# Patient Record
Sex: Female | Born: 1995 | Race: Black or African American | Hispanic: No | Smoking: Never smoker
Health system: Southern US, Community
[De-identification: ages and names within clinical notes are randomized; demographics above are authoritative.]

## PROBLEM LIST (undated history)

## (undated) DIAGNOSIS — M6740 Ganglion, unspecified site: Secondary | ICD-10-CM

## (undated) DIAGNOSIS — G5600 Carpal tunnel syndrome, unspecified upper limb: Secondary | ICD-10-CM

## (undated) DIAGNOSIS — M779 Enthesopathy, unspecified: Secondary | ICD-10-CM

## (undated) HISTORY — PX: CHOLECYSTECTOMY: SHX55

---

## 2014-06-06 ENCOUNTER — Emergency Department: Payer: Self-pay | Admitting: Emergency Medicine

## 2014-06-06 LAB — CBC WITH DIFFERENTIAL/PLATELET
BASOS PCT: 0.7 %
Basophil #: 0.1 10*3/uL (ref 0.0–0.1)
EOS PCT: 1.6 %
Eosinophil #: 0.1 10*3/uL (ref 0.0–0.7)
HCT: 42.5 % (ref 35.0–47.0)
HGB: 13.6 g/dL (ref 12.0–16.0)
Lymphocyte #: 2 10*3/uL (ref 1.0–3.6)
Lymphocyte %: 23.1 %
MCH: 29.4 pg (ref 26.0–34.0)
MCHC: 32 g/dL (ref 32.0–36.0)
MCV: 92 fL (ref 80–100)
Monocyte #: 0.6 x10 3/mm (ref 0.2–0.9)
Monocyte %: 6.5 %
Neutrophil #: 6 10*3/uL (ref 1.4–6.5)
Neutrophil %: 68.1 %
PLATELETS: 292 10*3/uL (ref 150–440)
RBC: 4.61 10*6/uL (ref 3.80–5.20)
RDW: 13.2 % (ref 11.5–14.5)
WBC: 8.8 10*3/uL (ref 3.6–11.0)

## 2014-06-06 LAB — URINALYSIS, COMPLETE
BLOOD: NEGATIVE
Bilirubin,UR: NEGATIVE
GLUCOSE, UR: NEGATIVE mg/dL (ref 0–75)
KETONE: NEGATIVE
NITRITE: NEGATIVE
Ph: 8 (ref 4.5–8.0)
Protein: 30
RBC,UR: 1 /HPF (ref 0–5)
SPECIFIC GRAVITY: 1.026 (ref 1.003–1.030)

## 2014-06-06 LAB — COMPREHENSIVE METABOLIC PANEL
ANION GAP: 6 — AB (ref 7–16)
Albumin: 4.5 g/dL (ref 3.8–5.6)
Alkaline Phosphatase: 55 U/L
BUN: 12 mg/dL (ref 7–18)
Bilirubin,Total: 0.4 mg/dL (ref 0.2–1.0)
CALCIUM: 9.2 mg/dL (ref 9.0–10.7)
Chloride: 107 mmol/L (ref 98–107)
Co2: 26 mmol/L (ref 21–32)
Creatinine: 0.97 mg/dL (ref 0.60–1.30)
GLUCOSE: 91 mg/dL (ref 65–99)
OSMOLALITY: 277 (ref 275–301)
POTASSIUM: 3.5 mmol/L (ref 3.5–5.1)
SGOT(AST): 15 U/L (ref 0–26)
SGPT (ALT): 23 U/L
SODIUM: 139 mmol/L (ref 136–145)
Total Protein: 8.1 g/dL (ref 6.4–8.6)

## 2014-06-06 LAB — HCG, QUANTITATIVE, PREGNANCY

## 2014-06-06 LAB — GC/CHLAMYDIA PROBE AMP

## 2014-06-07 LAB — WET PREP, GENITAL

## 2015-10-23 ENCOUNTER — Encounter: Payer: Self-pay | Admitting: Emergency Medicine

## 2015-10-23 ENCOUNTER — Emergency Department
Admission: EM | Admit: 2015-10-23 | Discharge: 2015-10-23 | Disposition: A | Discharge status: Home / Self Care | Payer: Self-pay | Attending: Emergency Medicine | Admitting: Emergency Medicine

## 2015-10-23 DIAGNOSIS — J069 Acute upper respiratory infection, unspecified: Secondary | ICD-10-CM | POA: Insufficient documentation

## 2015-10-23 DIAGNOSIS — R112 Nausea with vomiting, unspecified: Secondary | ICD-10-CM | POA: Insufficient documentation

## 2015-10-23 LAB — URINALYSIS COMPLETE WITH MICROSCOPIC (ARMC ONLY)
BACTERIA UA: NONE SEEN
Bilirubin Urine: NEGATIVE
Glucose, UA: NEGATIVE mg/dL
Hgb urine dipstick: NEGATIVE
Leukocytes, UA: NEGATIVE
Nitrite: NEGATIVE
PH: 6 (ref 5.0–8.0)
PROTEIN: 30 mg/dL — AB
Specific Gravity, Urine: 1.031 — ABNORMAL HIGH (ref 1.005–1.030)

## 2015-10-23 LAB — CBC WITH DIFFERENTIAL/PLATELET
Basophils Absolute: 0 10*3/uL (ref 0–0.1)
Basophils Relative: 0 %
EOS ABS: 0 10*3/uL (ref 0–0.7)
Eosinophils Relative: 0 %
HCT: 37.7 % (ref 35.0–47.0)
HEMOGLOBIN: 12.6 g/dL (ref 12.0–16.0)
LYMPHS ABS: 1.1 10*3/uL (ref 1.0–3.6)
LYMPHS PCT: 10 %
MCH: 30.1 pg (ref 26.0–34.0)
MCHC: 33.5 g/dL (ref 32.0–36.0)
MCV: 89.8 fL (ref 80.0–100.0)
MONOS PCT: 8 %
Monocytes Absolute: 0.8 10*3/uL (ref 0.2–0.9)
NEUTROS PCT: 82 %
Neutro Abs: 8.7 10*3/uL — ABNORMAL HIGH (ref 1.4–6.5)
Platelets: 221 10*3/uL (ref 150–440)
RBC: 4.19 MIL/uL (ref 3.80–5.20)
RDW: 13.1 % (ref 11.5–14.5)
WBC: 10.7 10*3/uL (ref 3.6–11.0)

## 2015-10-23 LAB — POCT PREGNANCY, URINE: PREG TEST UR: NEGATIVE

## 2015-10-23 LAB — BASIC METABOLIC PANEL
Anion gap: 7 (ref 5–15)
BUN: 7 mg/dL (ref 6–20)
CHLORIDE: 105 mmol/L (ref 101–111)
CO2: 23 mmol/L (ref 22–32)
CREATININE: 0.69 mg/dL (ref 0.44–1.00)
Calcium: 8.8 mg/dL — ABNORMAL LOW (ref 8.9–10.3)
GFR calc Af Amer: >60 mL/min (ref >60)
GFR calc non Af Amer: >60 mL/min (ref >60)
GLUCOSE: 101 mg/dL — AB (ref 65–99)
POTASSIUM: 3 mmol/L — AB (ref 3.5–5.1)
SODIUM: 135 mmol/L (ref 135–145)

## 2015-10-23 MED ORDER — PROMETHAZINE HCL 25 MG PO TABS: 25.0000 mg | ORAL_TABLET | Freq: Four times a day (QID) | ORAL | Status: DC | PRN

## 2015-10-23 MED ORDER — SODIUM CHLORIDE 0.9 % IV BOLUS (SEPSIS)
1000.0000 mL | Freq: Once | INTRAVENOUS | Status: AC
  Administered 2015-10-23: 1000 mL via INTRAVENOUS

## 2015-10-23 MED ORDER — AZITHROMYCIN 250 MG PO TABS: ORAL_TABLET | ORAL | Status: DC

## 2015-10-23 MED ORDER — GUAIFENESIN-CODEINE 100-10 MG/5ML PO SOLN: 10.0000 mL | ORAL | Status: DC | PRN

## 2015-10-23 MED ORDER — PROMETHAZINE HCL 25 MG PO TABS
25.0000 mg | ORAL_TABLET | Freq: Once | ORAL | Status: AC
  Administered 2015-10-23: 25 mg via ORAL
  Filled 2015-10-23: qty 1

## 2015-10-23 NOTE — ED Provider Notes (Signed)
[facility] Emergency Department Provider Note  ____________________________________________  Time seen: Approximately 2:16 PM  I have reviewed the triage vital signs and the nursing notes.   HISTORY  Chief Complaint Emesis    HPI Jordan George is a 20 y.o. female who presents for evaluation of sinus congestion sore throat cough for 3 days. Sudden onset of vomiting this morning. Patient states still feels nauseated with low-grade fever. Has felt hot and cold over the last several days. Head feels off.. Throat felt like she was swallowing glass but has gotten better with that. LMP was 2 days ago for 1 day only.   History reviewed. No pertinent past medical history.  There are no active problems to display for this patient.   Past Surgical History  Procedure Laterality Date  . Cholecystectomy      Current Outpatient Rx  Name  Route  Sig  Dispense  Refill  . azithromycin (ZITHROMAX Z-PAK) 250 MG tablet      Take 2 tablets (500 mg) on  Day 1,  followed by 1 tablet (250 mg) once daily on Days 2 through 5.   6 each   0   . guaiFENesin-codeine 100-10 MG/5ML syrup   Oral   Take 10 mLs by mouth every 4 (four) hours as needed for cough.   180 mL   0   . promethazine (PHENERGAN) 25 MG tablet   Oral   Take 1 tablet (25 mg total) by mouth every 6 (six) hours as needed for nausea or vomiting.   12 tablet   0     Allergies Review of patient's allergies indicates no known allergies.  No family history on file.  Social History Social History  Substance Use Topics  . Smoking status: Never Smoker   . Smokeless tobacco: None  . Alcohol Use: No    Review of Systems Constitutional: Positive fever/chills ENT: Positive sore throat. Cardiovascular: Denies chest pain. Respiratory: Denies shortness of breath. Positive for coughing Gastrointestinal: No abdominal pain.  Positive nausea, positive vomiting.  No diarrhea.   Genitourinary:  Negative for dysuria. Musculoskeletal: Negative for back pain. Skin: Negative for rash. Neurological: Negative for headaches, focal weakness or numbness.  10-point ROS otherwise negative.  ____________________________________________   PHYSICAL EXAM:  VITAL SIGNS: ED Triage Vitals  Enc Vitals Group     BP 10/23/15 1350 115/63 mmHg     Pulse Rate 10/23/15 1350 118     Resp 10/23/15 1350 20     Temp 10/23/15 1350 99.5 F (37.5 C)     Temp Source 10/23/15 1350 Oral     SpO2 10/23/15 1350 99 %     Weight 10/23/15 1350 110 lb (49.896 kg)     Height 10/23/15 1350 5\' 2"  (1.575 m)     Head Cir --      Peak Flow --      Pain Score 10/23/15 1352 8     Pain Loc --      Pain Edu? --      Excl. in GC? --     Constitutional: Alert and oriented. Well appearing and in no acute distress. Eyes: Conjunctivae are normal. PERRL. EOMI. Head: Atraumatic. Positive for frontal, maxillary sinus tenderness bilaterally. Nose: Positive congestion/rhinnorhea. Mouth/Throat: Mucous membranes are moist.  Oropharynx non-erythematous. Neck: No stridor.  Full range of motion nontender Cardiovascular: Normal rate, regular rhythm. Grossly normal heart sounds.  Good peripheral circulation. Respiratory: Normal respiratory effort.  No retractions. Lungs CTAB. Gastrointestinal: Soft and  nontender. No distention. No abdominal bruits. No CVA tenderness. Musculoskeletal: No lower extremity tenderness nor edema.  No joint effusions. Neurologic:  Normal speech and language. No gross focal neurologic deficits are appreciated. No gait instability. Skin:  Skin is warm, dry and intact. No rash noted. Psychiatric: Mood and affect are normal. Speech and behavior are normal.  ____________________________________________   LABS (all labs ordered are listed, but only abnormal results are displayed)  Labs Reviewed  CBC WITH DIFFERENTIAL/PLATELET - Abnormal; Notable for the following:    Neutro Abs 8.7 (*)    All other  components within normal limits  BASIC METABOLIC PANEL - Abnormal; Notable for the following:    Potassium 3.0 (*)    Glucose, Bld 101 (*)    Calcium 8.8 (*)    All other components within normal limits  URINALYSIS COMPLETEWITH MICROSCOPIC ([facility] ONLY) - Abnormal; Notable for the following:    Color, Urine YELLOW (*)    APPearance HAZY (*)    Ketones, ur 2+ (*)    Specific Gravity, Urine 1.031 (*)    Protein, ur 30 (*)    Squamous Epithelial / LPF 0-5 (*)    All other components within normal limits  POC URINE PREG, ED  POCT PREGNANCY, URINE      PROCEDURES  Procedure(s) performed: None  Critical Care performed: No  ____________________________________________   INITIAL IMPRESSION / ASSESSMENT AND PLAN / ED COURSE  Pertinent labs & imaging results that were available during my care of the patient were reviewed by me and considered in my medical decision making (see chart for details).  Reviewed labs. Patient is slightly dehydrated potassium 3.02+ ketones in her urine. We'll rehydrate with a bolus of normal saline. Discharge home with Rx for Phenergan, Z-Pak, Robitussin-AC. ____________________________________________   FINAL CLINICAL IMPRESSION(S) / ED DIAGNOSES  Final diagnoses:  URI, acute  Nausea and vomiting, vomiting of unspecified type     This chart was dictated using voice recognition software/Dragon. Despite best efforts to proofread, errors can occur which can change the meaning. Any change was purely unintentional.   Evangeline Dakin, PA-C 10/23/15 1540  Sharyn Creamer, MD 10/23/15 1547

## 2015-10-23 NOTE — ED Notes (Signed)
Pt reports sinus congestion x3 days; reports 3 episodes of vomiting today.

## 2015-10-23 NOTE — ED Notes (Signed)
See triage note.states she had some sinus congestion and cough for 3 days  But states she developed some vomiting after she got to work today  No vomiting on arrival  Low grade fever on arrival

## 2015-10-23 NOTE — Discharge Instructions (Signed)
Upper Respiratory Infection, Adult °Most upper respiratory infections (URIs) are a viral infection of the air passages leading to the lungs. A URI affects the nose, throat, and upper air passages. The most common type of URI is nasopharyngitis and is typically referred to as "the common cold." °URIs run their course and usually go away on their own. Most of the time, a URI does not require medical attention, but sometimes a bacterial infection in the upper airways can follow a viral infection. This is called a secondary infection. Sinus and middle ear infections are common types of secondary upper respiratory infections. °Bacterial pneumonia can also complicate a URI. A URI can worsen asthma and chronic obstructive pulmonary disease (COPD). Sometimes, these complications can require emergency medical care and may be life threatening.  °CAUSES °Almost all URIs are caused by viruses. A virus is a type of germ and can spread from one person to another.  °RISKS FACTORS °You may be at risk for a URI if:  °· You smoke.   °· You have chronic heart or lung disease. °· You have a weakened defense (immune) system.   °· You are very young or very old.   °· You have nasal allergies or asthma. °· You work in crowded or poorly ventilated areas. °· You work in health care facilities or schools. °SIGNS AND SYMPTOMS  °Symptoms typically develop 2-3 days after you come in contact with a cold virus. Most viral URIs last 7-10 days. However, viral URIs from the influenza virus (flu virus) can last 14-18 days and are typically more severe. Symptoms may include:  °· Runny or stuffy (congested) nose.   °· Sneezing.   °· Cough.   °· Sore throat.   °· Headache.   °· Fatigue.   °· Fever.   °· Loss of appetite.   °· Pain in your forehead, behind your eyes, and over your cheekbones (sinus pain). °· Muscle aches.   °DIAGNOSIS  °Your health care provider may diagnose a URI by: °· Physical exam. °· Tests to check that your symptoms are not due to  another condition such as: °· Strep throat. °· Sinusitis. °· Pneumonia. °· Asthma. °TREATMENT  °A URI goes away on its own with time. It cannot be cured with medicines, but medicines may be prescribed or recommended to relieve symptoms. Medicines may help: °· Reduce your fever. °· Reduce your cough. °· Relieve nasal congestion. °HOME CARE INSTRUCTIONS  °· Take medicines only as directed by your health care provider.   °· Gargle warm saltwater or take cough drops to comfort your throat as directed by your health care provider. °· Use a warm mist humidifier or inhale steam from a shower to increase air moisture. This may make it easier to breathe. °· Drink enough fluid to keep your urine clear or pale yellow.   °· Eat soups and other clear broths and maintain good nutrition.   °· Rest as needed.   °· Return to work when your temperature has returned to normal or as your health care provider advises. You may need to stay home longer to avoid infecting others. You can also use a face mask and careful hand washing to prevent spread of the virus. °· Increase the usage of your inhaler if you have asthma.   °· Do not use any tobacco products, including cigarettes, chewing tobacco, or electronic cigarettes. If you need help quitting, ask your health care provider. °PREVENTION  °The best way to protect yourself from getting a cold is to practice good hygiene.  °· Avoid oral or hand contact with people with cold   symptoms.   Wash your hands often if contact occurs.  There is no clear evidence that vitamin C, vitamin E, echinacea, or exercise reduces the chance of developing a cold. However, it is always recommended to get plenty of rest, exercise, and practice good nutrition.  SEEK MEDICAL CARE IF:   You are getting worse rather than better.   Your symptoms are not controlled by medicine.   You have chills.  You have worsening shortness of breath.  You have brown or red mucus.  You have yellow or brown nasal  discharge.  You have pain in your face, especially when you bend forward.  You have a fever.  You have swollen neck glands.  You have pain while swallowing.  You have white areas in the back of your throat. SEEK IMMEDIATE MEDICAL CARE IF:   You have severe or persistent:  Headache.  Ear pain.  Sinus pain.  Chest pain.  You have chronic lung disease and any of the following:  Wheezing.  Prolonged cough.  Coughing up blood.  A change in your usual mucus.  You have a stiff neck.  You have changes in your:  Vision.  Hearing.  Thinking.  Mood. MAKE SURE YOU:   Understand these instructions.  Will watch your condition.  Will get help right away if you are not doing well or get worse.   This information is not intended to replace advice given to you by your health care provider. Make sure you discuss any questions you have with your health care provider.   Document Released: 12/21/2000 Document Revised: 11/11/2014 Document Reviewed: 10/02/2013 Elsevier Interactive Patient Education 2016 Elsevier Inc.  Nausea and Vomiting Nausea means you feel sick to your stomach. Throwing up (vomiting) is a reflex where stomach contents come out of your mouth. HOME CARE   Take medicine as told by your doctor.  Do not force yourself to eat. However, you do need to drink fluids.  If you feel like eating, eat a normal diet as told by your doctor.  Eat rice, wheat, potatoes, bread, lean meats, yogurt, fruits, and vegetables.  Avoid high-fat foods.  Drink enough fluids to keep your pee (urine) clear or pale yellow.  Ask your doctor how to replace body fluid losses (rehydrate). Signs of body fluid loss (dehydration) include:  Feeling very thirsty.  Dry lips and mouth.  Feeling dizzy.  Dark pee.  Peeing less than normal.  Feeling confused.  Fast breathing or heart rate. GET HELP RIGHT AWAY IF:   You have blood in your throw up.  You have black or bloody  poop (stool).  You have a bad headache or stiff neck.  You feel confused.  You have bad belly (abdominal) pain.  You have chest pain or trouble breathing.  You do not pee at least once every 8 hours.  You have cold, clammy skin.  You keep throwing up after 24 to 48 hours.  You have a fever. MAKE SURE YOU:   Understand these instructions.  Will watch your condition.  Will get help right away if you are not doing well or get worse.   This information is not intended to replace advice given to you by your health care provider. Make sure you discuss any questions you have with your health care provider.   Document Released: 12/14/2007 Document Revised: 09/19/2011 Document Reviewed: 11/26/2010 Elsevier Interactive Patient Education [employer] Elsevier Inc.

## 2016-09-26 ENCOUNTER — Encounter (HOSPITAL_COMMUNITY): Payer: Self-pay | Admitting: Family Medicine

## 2016-09-26 ENCOUNTER — Ambulatory Visit (HOSPITAL_COMMUNITY)
Admission: EM | Admit: 2016-09-26 | Discharge: 2016-09-26 | Disposition: A | Discharge status: Home / Self Care | Payer: Self-pay | Attending: Family Medicine | Admitting: Family Medicine

## 2016-09-26 DIAGNOSIS — M67432 Ganglion, left wrist: Secondary | ICD-10-CM

## 2016-09-26 MED ORDER — PREDNISONE 20 MG PO TABS: ORAL_TABLET | ORAL | 0 refills | Status: DC

## 2016-09-26 NOTE — ED Triage Notes (Signed)
Pt here with left wrist pain. sts this pain is chronic but worse over the past few days. sts she usually wears and brace and that helps but not this time.

## 2016-09-26 NOTE — Discharge Instructions (Signed)
This a ganglion cyst that often occurs after repetitive motion type activities. The treatment of choice is surgery although the medicine given you should help somewhat. Please make an appointment with the orthopedist noted.

## 2016-09-26 NOTE — ED Provider Notes (Signed)
[facility]    CSN: [id number] Arrival date & time: 09/26/16  1001     History   Chief Complaint Chief Complaint  Patient presents with  . Wrist Pain    HPI Jordan George is a 21 y.o. female.   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 She's had chronic pain in her left wrist, initially along the radial aspect but now more dorsal. She is alsosome swelling in the dorsal aspect of her wrist. Pain got fairly intense last night and has not been relieved by ibuprofen. She's had no trauma to the wrist.      History reviewed. No pertinent past medical history.  There are no active problems to display for this patient.   Past Surgical History:  Procedure Laterality Date  . CHOLECYSTECTOMY      OB History    No data available       Home Medications    Prior to Admission medications   Medication Sig Start Date End Date Taking? Authorizing Provider  predniSONE (DELTASONE) 20 MG tablet Two daily with food 09/26/16   Elvina Sidle, MD    Family History History reviewed. No pertinent family history.  Social History Social History  Substance Use Topics  . Smoking status: Never Smoker  . Smokeless tobacco: Never Used  . Alcohol use No     Allergies   Patient has no known allergies.   Review of Systems Review of Systems  Musculoskeletal: Positive for joint swelling.  All other systems reviewed and are negative.    Physical Exam Triage Vital Signs ED Triage Vitals  Enc Vitals Group     BP 09/26/16 1014 121/70     Pulse Rate 09/26/16 1014 81     Resp 09/26/16 1014 18     Temp 09/26/16 1014 98.3 F (36.8 C)     Temp src --      SpO2 09/26/16 1014 100 %     Weight --      Height --      Head Circumference --      Peak Flow --      Pain Score 09/26/16 1017 7     Pain Loc --      Pain Edu? --      Excl. in GC? --    No data  found.   Updated Vital Signs BP 121/70   Pulse 81   Temp 98.3 F (36.8 C)   Resp 18   LMP 09/12/2016   SpO2 100%    Physical Exam  Constitutional: She is oriented to person, place, and time. She appears well-developed and well-nourished.  HENT:  Right Ear: External ear normal.  Left Ear: External ear normal.  Eyes: Conjunctivae and EOM are normal. Pupils are equal, round, and reactive to light.  Neck: Normal range of motion. Neck supple.  Pulmonary/Chest: Effort normal.  Musculoskeletal: Normal range of motion.  Tender dorsal left wrist with half centimeter movable insistent with ganglion cyst  Neurological: She is alert and oriented to person, place, and time.  Skin: Skin is warm and dry.  Nursing note and vitals reviewed.    UC Treatments / Results  Labs (all labs ordered are listed, but only abnormal results are displayed) Labs Reviewed - No data to display  EKG  EKG Interpretation None       Radiology No results found.  Procedures Procedures (including critical care  time)  Medications Ordered in UC Medications - No data to display   Initial Impression / Assessment and Plan / UC Course  I have reviewed the triage vital signs and the nursing notes.  Pertinent labs & imaging results that were available during my care of the patient were reviewed by me and considered in my medical decision making (see chart for details).     Final Clinical Impressions(s) / UC Diagnoses   Final diagnoses:  Ganglion of left wrist    New Prescriptions New Prescriptions   PREDNISONE (DELTASONE) 20 MG TABLET    Two daily with food     Elvina SidleKurt Carmell Elgin, MD 09/26/16 1039

## 2016-11-29 ENCOUNTER — Encounter: Payer: Self-pay | Admitting: Emergency Medicine

## 2016-11-29 DIAGNOSIS — J069 Acute upper respiratory infection, unspecified: Secondary | ICD-10-CM | POA: Insufficient documentation

## 2016-11-29 DIAGNOSIS — R55 Syncope and collapse: Secondary | ICD-10-CM | POA: Insufficient documentation

## 2016-11-29 LAB — COMPREHENSIVE METABOLIC PANEL
ALBUMIN: 4.5 g/dL (ref 3.5–5.0)
ALK PHOS: 41 U/L (ref 38–126)
ALT: 16 U/L (ref 14–54)
AST: 23 U/L (ref 15–41)
Anion gap: 7 (ref 5–15)
BILIRUBIN TOTAL: 0.8 mg/dL (ref 0.3–1.2)
BUN: 10 mg/dL (ref 6–20)
CALCIUM: 9.4 mg/dL (ref 8.9–10.3)
CO2: 26 mmol/L (ref 22–32)
Chloride: 103 mmol/L (ref 101–111)
Creatinine, Ser: 0.74 mg/dL (ref 0.44–1.00)
GFR calc Af Amer: >60 mL/min (ref >60)
GFR calc non Af Amer: >60 mL/min (ref >60)
GLUCOSE: 102 mg/dL — AB (ref 65–99)
Potassium: 4 mmol/L (ref 3.5–5.1)
SODIUM: 136 mmol/L (ref 135–145)
TOTAL PROTEIN: 8.2 g/dL — AB (ref 6.5–8.1)

## 2016-11-29 LAB — URINALYSIS, COMPLETE (UACMP) WITH MICROSCOPIC
BILIRUBIN URINE: NEGATIVE
Glucose, UA: NEGATIVE mg/dL
KETONES UR: 5 mg/dL — AB
LEUKOCYTES UA: NEGATIVE
Nitrite: NEGATIVE
PH: 5 (ref 5.0–8.0)
PROTEIN: 30 mg/dL — AB
Specific Gravity, Urine: 1.031 — ABNORMAL HIGH (ref 1.005–1.030)

## 2016-11-29 LAB — CBC
HEMATOCRIT: 40 % (ref 35.0–47.0)
HEMOGLOBIN: 13.5 g/dL (ref 12.0–16.0)
MCH: 30 pg (ref 26.0–34.0)
MCHC: 33.8 g/dL (ref 32.0–36.0)
MCV: 88.8 fL (ref 80.0–100.0)
Platelets: 257 10*3/uL (ref 150–440)
RBC: 4.5 MIL/uL (ref 3.80–5.20)
RDW: 13.3 % (ref 11.5–14.5)
WBC: 13.2 10*3/uL — ABNORMAL HIGH (ref 3.6–11.0)

## 2016-11-29 LAB — POCT PREGNANCY, URINE: PREG TEST UR: NEGATIVE

## 2016-11-29 LAB — LIPASE, BLOOD: Lipase: 25 U/L (ref 11–51)

## 2016-11-29 NOTE — ED Triage Notes (Signed)
Patient ambulatory to triage with steady gait, without difficulty or distress noted, mask in place; pt reports fever, congestion, HA since this morning with vomiting with general abd pain

## 2016-11-30 ENCOUNTER — Emergency Department
Admission: EM | Admit: 2016-11-30 | Discharge: 2016-11-30 | Disposition: A | Discharge status: Home / Self Care | Payer: Self-pay | Attending: Emergency Medicine | Admitting: Emergency Medicine

## 2016-11-30 DIAGNOSIS — J069 Acute upper respiratory infection, unspecified: Secondary | ICD-10-CM

## 2016-11-30 DIAGNOSIS — R55 Syncope and collapse: Secondary | ICD-10-CM

## 2016-11-30 LAB — TROPONIN I: Troponin I: <0.03 ng/mL (ref <0.03)

## 2016-11-30 MED ORDER — SODIUM CHLORIDE 0.9 % IV BOLUS (SEPSIS)
1000.0000 mL | Freq: Once | INTRAVENOUS | Status: AC
  Administered 2016-11-30: 1000 mL via INTRAVENOUS

## 2016-11-30 MED ORDER — KETOROLAC TROMETHAMINE 30 MG/ML IJ SOLN
30.0000 mg | Freq: Once | INTRAMUSCULAR | Status: AC
  Administered 2016-11-30: 30 mg via INTRAVENOUS
  Filled 2016-11-30: qty 1

## 2016-11-30 MED ORDER — ONDANSETRON HCL 4 MG/2ML IJ SOLN
4.0000 mg | Freq: Once | INTRAMUSCULAR | Status: AC
  Administered 2016-11-30: 4 mg via INTRAVENOUS
  Filled 2016-11-30: qty 2

## 2016-11-30 NOTE — ED Provider Notes (Addendum)
[facility] Emergency Department Provider Note  Time seen: 1:16 AM  I have reviewed the triage vital signs and the nursing notes.   HISTORY  Chief Complaint Fever and Emesis    HPI Jordan George is a 21 y.o. female with no past medical history who presents to the emergency Department after syncopal episode at work. According to the patient since Monday she has been experiencing a sore throat and congestion. States she began experiencing a fever yesterday along with cough congestion nausea with occasional episodes of vomiting. States mild abdominal cramping. Denies chest pain or shortness of breath. Patient states moderate headache. She was at work began feeling lightheaded and weak and had a brief syncopal first near syncopal episode. Patient states she had a fever of 102 earlier today and took ibuprofen but has not taken any medications since this morning.  History reviewed. No pertinent past medical history.  There are no active problems to display for this patient.   Past Surgical History:  Procedure Laterality Date  . CHOLECYSTECTOMY      Prior to Admission medications   Medication Sig Start Date End Date Taking? Authorizing Provider  predniSONE (DELTASONE) 20 MG tablet Two daily with food 09/26/16   Elvina Sidle, MD    No Known Allergies  No family history on file.  Social History Social History  Substance Use Topics  . Smoking status: Never Smoker  . Smokeless tobacco: Never Used  . Alcohol use No    Review of Systems Constitutional: Positive for fever Eyes: Negative for visual changes. ENT: Positive for congestion Cardiovascular: Negative for chest pain. Respiratory: Negative for shortness of breath. Positive for cough Gastrointestinal: Mild abdominal cramping. Positive for nausea and vomiting earlier today. Negative for diarrhea. Genitourinary: Negative for dysuria. Musculoskeletal: Negative for back pain. Skin:  Negative for rash. Neurological: Negative for headache All other ROS negative  ____________________________________________   PHYSICAL EXAM:  VITAL SIGNS: ED Triage Vitals  Enc Vitals Group     BP 11/29/16 2337 134/73     Pulse Rate 11/29/16 2337 89     Resp 11/29/16 2337 18     Temp 11/29/16 2337 98.8 F (37.1 C)     Temp Source 11/29/16 2337 Oral     SpO2 11/29/16 2337 99 %     Weight 11/29/16 2335 130 lb (59 kg)     Height 11/29/16 2335 5\' 2"  (1.575 m)     Head Circumference --      Peak Flow --      Pain Score 11/29/16 2335 7     Pain Loc --      Pain Edu? --      Excl. in GC? --     Constitutional: Alert and oriented. Well appearing and in no distress. Eyes: Normal exam ENT   Head: Normocephalic and atraumatic.   Mouth/Throat: Mucous membranes are moist. Moderate rhinorrhea. Mild frontal erythema without exudates or tonsillar hypertrophy. Cardiovascular: Normal rate, regular rhythm. No murmur Respiratory: Normal respiratory effort without tachypnea nor retractions. Breath sounds are clear. No wheezes rales or rhonchi. Gastrointestinal: Soft and nontender. No distention.   Musculoskeletal: Nontender with normal range of motion in all extremities.  Neurologic:  Normal speech and language. No gross focal neurologic deficits Skin:  Skin is warm, dry and intact.  Psychiatric: Mood and affect are normal. Speech and behavior are normal.   ____________________________________________   INITIAL IMPRESSION / ASSESSMENT AND PLAN / ED COURSE  Pertinent labs & imaging results that  were available during my care of the patient were reviewed by me and considered in my medical decision making (see chart for details).  Patient presents to emergency department after syncopal episode. Patient's workup shows a mild leukocytosis of 13,000, otherwise largely negative workup. Patient's symptoms are very suggestive of upper respiratory infection. Patient states congestion, sore  throat and fever and cough. Patient has had intermittent nausea and vomiting over the past 2 days which she states is usually when she takes medication. We will treat with IV fluids, Zofran and Toradol in the emergency department. Given the patient's syncopal episode pending a troponin and EKG.  Patient states she is feeling much better after medications. Patient's labs are largely within normal limits besides a mild leukocytosis. Troponin negative.  EKG reviewed and interpreted by myself shows normal sinus rhythm at 89 bpm, narrow QRS, normal axis, normal appearing intervals, no ST changes. ____________________________________________   FINAL CLINICAL IMPRESSION(S) / ED DIAGNOSES  URI syncope    Minna AntisPaduchowski, Daliah Chaudoin, MD 11/30/16 0120    Minna AntisPaduchowski, Imanii Gosdin, MD 11/30/16 [id number]

## 2016-11-30 NOTE — ED Notes (Addendum)
Patient states this started Monday with a sore throat. She got dayquil. Fever started Tuesday morning, got sent home from work, had vomiting after taking medication and eating. Patient does have a cough, congestion and headache. Patient states she "fell out" at work. Felt weak and passed out.

## 2017-03-22 ENCOUNTER — Ambulatory Visit (HOSPITAL_COMMUNITY)
Admission: EM | Admit: 2017-03-22 | Discharge: 2017-03-22 | Disposition: A | Discharge status: Home / Self Care | Payer: Self-pay | Attending: Family Medicine | Admitting: Family Medicine

## 2017-03-22 ENCOUNTER — Encounter (HOSPITAL_COMMUNITY): Payer: Self-pay | Admitting: Nurse Practitioner

## 2017-03-22 DIAGNOSIS — M25532 Pain in left wrist: Secondary | ICD-10-CM

## 2017-03-22 MED ORDER — KETOROLAC TROMETHAMINE 30 MG/ML IJ SOLN
INTRAMUSCULAR | Status: AC
  Filled 2017-03-22: qty 1

## 2017-03-22 MED ORDER — NAPROXEN 500 MG PO TABS: 500.0000 mg | ORAL_TABLET | Freq: Two times a day (BID) | ORAL | 0 refills | Status: DC

## 2017-03-22 MED ORDER — METHYLPREDNISOLONE SODIUM SUCC 125 MG IJ SOLR
80.0000 mg | Freq: Once | INTRAMUSCULAR | Status: AC
  Administered 2017-03-22: 80 mg via INTRAMUSCULAR

## 2017-03-22 MED ORDER — METHYLPREDNISOLONE SODIUM SUCC 125 MG IJ SOLR
INTRAMUSCULAR | Status: AC
  Filled 2017-03-22: qty 2

## 2017-03-22 MED ORDER — KETOROLAC TROMETHAMINE 30 MG/ML IJ SOLN
30.0000 mg | Freq: Once | INTRAMUSCULAR | Status: AC
  Administered 2017-03-22: 30 mg via INTRAMUSCULAR

## 2017-03-22 NOTE — ED Provider Notes (Signed)
[facility]    CSN: [id number] Arrival date & time: 03/22/17  1342     History   Chief Complaint Chief Complaint  Patient presents with  . Cyst    HPI Ardra Quentin Cornwall is a 21 y.o. female.   21 year old female comes in for left wrist pain. She was seen 6 months ago for similar symptoms, and was found to have a ganglion cyst, she was given a wrist splint and prednisone treatment, which she states helped her symptoms. 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 She recently rejoined the assembly line, and started having these symptoms. She states new materials are also heavier than what she used to do, which exacerbates the pain. Repetitive motion includes lots of turning of the wrist. She noticed numbness/tingling and pain radiation to her second and third fingers occasionally. She sleeps with her left wrist flexed, which exacerbates the pain. She has been wearing her splint to work, and has been taking Tylenol without relief. Denies swelling, spreading erythema, increased warmth.       History reviewed. No pertinent past medical history.  There are no active problems to display for this patient.   Past Surgical History:  Procedure Laterality Date  . CHOLECYSTECTOMY      OB History    No data available       Home Medications    Prior to Admission medications   Medication Sig Start Date End Date Taking? Authorizing Provider  naproxen (NAPROSYN) 500 MG tablet Take 1 tablet (500 mg total) by mouth 2 (two) times daily. 03/22/17   Belinda Fisher, PA-C  predniSONE (DELTASONE) 20 MG tablet Two daily with food 09/26/16   Elvina Sidle, MD    Family History History reviewed. No pertinent family history.  Social History Social History  Substance Use Topics  . Smoking  status: Never Smoker  . Smokeless tobacco: Never Used  . Alcohol use Yes     Allergies   Patient has no known allergies.   Review of Systems Review of Systems  Reason unable to perform ROS: See HPI as above.     Physical Exam Triage Vital Signs ED Triage Vitals  Enc Vitals Group     BP 03/22/17 1356 129/66     Pulse Rate 03/22/17 1356 80     Resp 03/22/17 1356 16     Temp 03/22/17 1356 98.5 F (36.9 C)     Temp Source 03/22/17 1356 Oral     SpO2 03/22/17 1356 100 %     Weight --      Height --      Head Circumference --      Peak Flow --      Pain Score 03/22/17 1359 7     Pain Loc --      Pain Edu? --      Excl. in GC? --    No data found.   Updated Vital Signs BP 129/66   Pulse 80   Temp 98.5 F (36.9 C) (Oral)   Resp 16   SpO2 100%   Physical Exam  Constitutional: She is oriented to person, place, and time. She appears well-developed and well-nourished. No distress.  HENT:  Head: Normocephalic and atraumatic.  Eyes: Pupils are equal, round, and reactive to light. Conjunctivae are normal.  Musculoskeletal:  No obvious swelling, erythema noted. Tenderness on palpation of dorsal aspect of left wrist, where ganglion cyst is located. No tenderness on palpation of the fingers. Full ROM though with pain. Grip strength slightly decreased in left hand. Sensation intact and equal bilaterally. Radial pulses 2+ and equal bilaterally. Cap refill unable to assess due to nail polish.   Negative Tinel, Finkelstein's. Positive Phalen.  Neurological: She is alert and oriented to person, place, and time.     UC Treatments / Results  Labs (all labs ordered are listed, but only abnormal results are displayed) Labs Reviewed - No data to display  EKG  EKG Interpretation None       Radiology No results found.  Procedures Procedures (including critical care time)  Medications Ordered in UC Medications  ketorolac (TORADOL) 30 MG/ML injection 30 mg (30 mg  Intramuscular Given 03/22/17 1509)  methylPREDNISolone sodium succinate (SOLU-MEDROL) 125 mg/2 mL injection 80 mg (80 mg Intramuscular Given 03/22/17 1510)     Initial Impression / Assessment and Plan / UC Course  I have reviewed the triage vital signs and the nursing notes.  Pertinent labs & imaging results that were available during my care of the patient were reviewed by me and considered in my medical decision making (see chart for details).    Start NSAIDs as directed. Ice compress. Wear wrist splint during work and sleep. Solu-Medrol injection in office. Patient to follow up with orthopedics for further evaluation and treatment needed.  Final Clinical Impressions(s) / UC Diagnoses   Final diagnoses:  Left wrist pain    New Prescriptions Discharge Medication List as of 03/22/2017  2:42 PM    START taking these medications   Details  naproxen (NAPROSYN) 500 MG tablet Take 1 tablet (500 mg total) by mouth 2 (two) times daily., Starting Wed 03/22/2017, Normal           Linward HeadlandYu, Margarette Vannatter V, PA-C 03/22/17 1517

## 2017-03-22 NOTE — ED Triage Notes (Signed)
Pt presents with c/o ganglion cyst. She was diagnosed with the cyst here in March. She was discharged home with instructions to ice the wrist and wear a splint for about three months. She followed discharge instructions with no improvement and has has increasing pain from the cyst. 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

## 2017-03-22 NOTE — Discharge Instructions (Signed)
Start Naproxen as directed. Wear wrist splint during sleep and activity. Ice compress. Make appointment with orthopedics for further evaluation and treatment needed.

## 2017-04-28 ENCOUNTER — Emergency Department
Admission: EM | Admit: 2017-04-28 | Discharge: 2017-04-28 | Disposition: A | Discharge status: Home / Self Care | Payer: Self-pay | Attending: Emergency Medicine | Admitting: Emergency Medicine

## 2017-04-28 ENCOUNTER — Encounter: Payer: Self-pay | Admitting: Emergency Medicine

## 2017-04-28 DIAGNOSIS — M779 Enthesopathy, unspecified: Secondary | ICD-10-CM | POA: Insufficient documentation

## 2017-04-28 DIAGNOSIS — M25532 Pain in left wrist: Secondary | ICD-10-CM | POA: Insufficient documentation

## 2017-04-28 DIAGNOSIS — Z79899 Other long term (current) drug therapy: Secondary | ICD-10-CM | POA: Insufficient documentation

## 2017-04-28 MED ORDER — KETOROLAC TROMETHAMINE 30 MG/ML IJ SOLN
60.0000 mg | Freq: Once | INTRAMUSCULAR | Status: AC
  Administered 2017-04-28: 60 mg via INTRAMUSCULAR
  Filled 2017-04-28: qty 2

## 2017-04-28 MED ORDER — IBUPROFEN 600 MG PO TABS: 600.0000 mg | ORAL_TABLET | Freq: Three times a day (TID) | ORAL | 0 refills | Status: DC | PRN

## 2017-04-28 MED ORDER — METHYLPREDNISOLONE 4 MG PO TBPK: ORAL_TABLET | ORAL | 0 refills | Status: DC

## 2017-04-28 MED ORDER — HYDROCODONE-ACETAMINOPHEN 5-325 MG PO TABS: 1.0000 | ORAL_TABLET | Freq: Four times a day (QID) | ORAL | 0 refills | Status: DC | PRN

## 2017-04-28 NOTE — ED Provider Notes (Signed)
[facility] Emergency Department Provider Note   ____________________________________________   First MD Initiated Contact with Patient 04/28/17 [id number]     (approximate)  I have reviewed the triage vital signs and the nursing notes.   HISTORY  Chief Complaint Wrist Pain    HPI Jordan George is a 21 y.o. female who presents to the ED from work [employer]) with a chief complaint of nontraumatic left wrist pain. Patient was diagnosed with left wrist tendinitis 2 months ago, is followed by [facility] orthopedics, had a steroid shot 2 months ago and is doing physical therapy. Wears a wrist splint. Tonight at work she was having to do rotating-type motions which exacerbated her pain. Took an ibuprofen multiple hours ago without relief of symptoms. Movement makes her pain worse. Complains of some numbness to her fourth and fifth digits. Did not strike her elbow or fall. Voices no other medical complaints. Specifically, denies fever, chills, chest pain, shortness breath, abdominal pain, nausea, vomiting.Denies recent falls/trauma/injury.   Past Medical History None  There are no active problems to display for this patient.   Past Surgical History:  Procedure Laterality Date  . CHOLECYSTECTOMY      Prior to Admission medications   Medication Sig Start Date End Date Taking? Authorizing Provider  HYDROcodone-acetaminophen (NORCO) 5-325 MG tablet Take 1 tablet by mouth every 6 (six) hours as needed for moderate pain. 04/28/17   Irean Hong, MD  ibuprofen (ADVIL,MOTRIN) 600 MG tablet Take 1 tablet (600 mg total) by mouth every 8 (eight) hours as needed. 04/28/17   Irean Hong, MD  methylPREDNISolone (MEDROL DOSEPAK) 4 MG TBPK tablet Take as directed 04/28/17   Irean Hong, MD  naproxen (NAPROSYN) 500 MG tablet Take 1 tablet (500 mg total) by mouth 2 (two) times daily. 03/22/17   Belinda Fisher, PA-C  predniSONE (DELTASONE) 20 MG tablet Two daily with food  09/26/16   Elvina Sidle, MD    Allergies Patient has no known allergies.  No family history on file.  Social History Social History  Substance Use Topics  . Smoking status: Never Smoker  . Smokeless tobacco: Never Used  . Alcohol use Yes    Review of Systems  Constitutional: No fever/chills. Eyes: No visual changes. ENT: No sore throat. Cardiovascular: Denies chest pain. Respiratory: Denies shortness of breath. Gastrointestinal: No abdominal pain.  No nausea, no vomiting.  No diarrhea.  No constipation. Genitourinary: Negative for dysuria. Musculoskeletal: positive for left wrist pain. Negative for back pain. Skin: Negative for rash. Neurological: Negative for headaches, focal weakness or numbness.   ____________________________________________   PHYSICAL EXAM:  VITAL SIGNS: ED Triage Vitals  Enc Vitals Group     BP 04/28/17 0136 115/75     Pulse Rate 04/28/17 0136 89     Resp 04/28/17 0136 16     Temp 04/28/17 0136 98.4 F (36.9 C)     Temp Source 04/28/17 0136 Oral     SpO2 04/28/17 0136 100 %     Weight 04/28/17 0134 130 lb (59 kg)     Height 04/28/17 0134 5\' 2"  (1.575 m)     Head Circumference --      Peak Flow --      Pain Score 04/28/17 0134 8     Pain Loc --      Pain Edu? --      Excl. in GC? --     Constitutional: Asleep, awakened for exam. Alert and oriented. Well appearing  and in no acute distress. Eyes: Conjunctivae are normal. PERRL. EOMI. Head: Atraumatic. Nose: No congestion/rhinnorhea. Mouth/Throat: Mucous membranes are moist.  Oropharynx non-erythematous. Neck: No stridor.  No cervical spine tenderness to palpation. Cardiovascular: Normal rate, regular rhythm. Grossly normal heart sounds.  Good peripheral circulation. Respiratory: Normal respiratory effort.  No retractions. Lungs CTAB. Gastrointestinal: Soft and nontender. No distention. No abdominal bruits. No CVA tenderness. Musculoskeletal:  Left wrist in Velcro splint. Splint  removed for examination. Dorsal medial wrist tender to palpation. No swelling. Full range of motion with some pain. Negative Tinel's. 2+ distal pulses. Brisk, less than 5 second capillary refill. Subjective decreased sensation to left fourth and fifth digits. No pain at left elbow. Neurologic:  Normal speech and language. No gross focal neurologic deficits are appreciated. No gait instability. Skin:  Skin is warm, dry and intact. No rash noted. Psychiatric: Mood and affect are normal. Speech and behavior are normal.  ____________________________________________   LABS (all labs ordered are listed, but only abnormal results are displayed)  Labs Reviewed - No data to display ____________________________________________  EKG  None ____________________________________________  RADIOLOGY  No results found.  ____________________________________________   PROCEDURES  Procedure(s) performed: None  Procedures  Critical Care performed: No  ____________________________________________   INITIAL IMPRESSION / ASSESSMENT AND PLAN / ED COURSE  As part of my medical decision making, I reviewed the following data within the electronic MEDICAL RECORD NUMBER Nursing notes reviewed and incorporated, Old chart reviewed and Notes from prior ED visits   21 year old female with tendinitis, [facility], who exacerbated wrist pain tonight while at work twisting a car part. Will treat with NSAID's, analgesia, Medrol Dosepak and patient will follow-up with orthopedics next week. Strict return precautions given. Patient verbalizes understanding and agrees with plan of care.      ____________________________________________   FINAL CLINICAL IMPRESSION(S) / ED DIAGNOSES  Final diagnoses:  Left wrist pain  Tendonitis      NEW MEDICATIONS STARTED DURING THIS VISIT:  New Prescriptions   HYDROCODONE-ACETAMINOPHEN (NORCO) 5-325 MG TABLET    Take 1 tablet by mouth  every 6 (six) hours as needed for moderate pain.   IBUPROFEN (ADVIL,MOTRIN) 600 MG TABLET    Take 1 tablet (600 mg total) by mouth every 8 (eight) hours as needed.   METHYLPREDNISOLONE (MEDROL DOSEPAK) 4 MG TBPK TABLET    Take as directed     Note:  This document was prepared using Dragon voice recognition software and may include unintentional dictation errors.    Irean HongSung, Patt Steinhardt J, MD 04/28/17 0730

## 2017-04-28 NOTE — ED Triage Notes (Addendum)
Patient ambulatory to triage with steady gait, without difficulty or distress noted; pt reports dx left wrist tendonitis, having increased pain; velcro splint in place; pain unrelieved by ibuprofen, currently receiving physical therapy and had had steroid injection as well

## 2017-04-28 NOTE — Discharge Instructions (Signed)
1. Take pain medicines as needed (Motrin/Norco #15). 2. Take Medrol Dosepak as prescribed. 3. Continue to wear your wrist splint. 4. Return to the ER for worsening symptoms, persistent vomiting, difficulty breathing or other concerns.

## 2017-06-12 ENCOUNTER — Emergency Department (HOSPITAL_COMMUNITY): Payer: Self-pay

## 2017-06-12 ENCOUNTER — Emergency Department (HOSPITAL_COMMUNITY)
Admission: EM | Admit: 2017-06-12 | Discharge: 2017-06-12 | Disposition: A | Discharge status: Home / Self Care | Payer: Self-pay | Attending: Emergency Medicine | Admitting: Emergency Medicine

## 2017-06-12 ENCOUNTER — Other Ambulatory Visit: Payer: Self-pay

## 2017-06-12 DIAGNOSIS — Z79899 Other long term (current) drug therapy: Secondary | ICD-10-CM | POA: Insufficient documentation

## 2017-06-12 DIAGNOSIS — R1031 Right lower quadrant pain: Secondary | ICD-10-CM | POA: Insufficient documentation

## 2017-06-12 LAB — COMPREHENSIVE METABOLIC PANEL
ALBUMIN: 3.6 g/dL (ref 3.5–5.0)
ALT: 12 U/L — AB (ref 14–54)
ANION GAP: 6 (ref 5–15)
AST: 15 U/L (ref 15–41)
Alkaline Phosphatase: 40 U/L (ref 38–126)
BUN: 9 mg/dL (ref 6–20)
CHLORIDE: 108 mmol/L (ref 101–111)
CO2: 22 mmol/L (ref 22–32)
CREATININE: 0.75 mg/dL (ref 0.44–1.00)
Calcium: 8.7 mg/dL — ABNORMAL LOW (ref 8.9–10.3)
GFR calc non Af Amer: >60 mL/min (ref >60)
GLUCOSE: 89 mg/dL (ref 65–99)
Potassium: 3.7 mmol/L (ref 3.5–5.1)
SODIUM: 136 mmol/L (ref 135–145)
Total Bilirubin: 0.5 mg/dL (ref 0.3–1.2)
Total Protein: 6.5 g/dL (ref 6.5–8.1)

## 2017-06-12 LAB — URINALYSIS, ROUTINE W REFLEX MICROSCOPIC
Bilirubin Urine: NEGATIVE
Glucose, UA: NEGATIVE mg/dL
Hgb urine dipstick: NEGATIVE
KETONES UR: NEGATIVE mg/dL
Nitrite: NEGATIVE
PROTEIN: NEGATIVE mg/dL
Specific Gravity, Urine: 1.025 (ref 1.005–1.030)
pH: 5 (ref 5.0–8.0)

## 2017-06-12 LAB — CBC
HCT: 37.8 % (ref 36.0–46.0)
HEMOGLOBIN: 12.3 g/dL (ref 12.0–15.0)
MCH: 29.3 pg (ref 26.0–34.0)
MCHC: 32.5 g/dL (ref 30.0–36.0)
MCV: 90 fL (ref 78.0–100.0)
Platelets: 302 10*3/uL (ref 150–400)
RBC: 4.2 MIL/uL (ref 3.87–5.11)
RDW: 13.8 % (ref 11.5–15.5)
WBC: 7.4 10*3/uL (ref 4.0–10.5)

## 2017-06-12 LAB — I-STAT BETA HCG BLOOD, ED (MC, WL, AP ONLY)

## 2017-06-12 LAB — LIPASE, BLOOD: LIPASE: 39 U/L (ref 11–51)

## 2017-06-12 MED ORDER — IOPAMIDOL (ISOVUE-300) INJECTION 61%
INTRAVENOUS | Status: AC
  Administered 2017-06-12: 100 mL
  Filled 2017-06-12: qty 100

## 2017-06-12 NOTE — ED Provider Notes (Signed)
[facility] EMERGENCY DEPARTMENT Provider Note   CSN: [id number] Arrival date & time: 06/12/17  0035     History   Chief Complaint Chief Complaint  Patient presents with  . Abdominal Pain    HPI Jordan George is a 21 y.o. female.  She presents for evaluation of a vague sensation of pain, in her right abdomen, similar to when she had gallstones.  The pain comes and goes, and has been present for several days.  She denies nausea, vomiting, cough, chest pain, dysuria, urinary frequency, hematuria, vaginal discharge or fever and chills.  No prior similar problem.  Last menstrual period 2 weeks ago and on time.  She does not think that she is pregnant.  There are no other known modifying factors.  HPI  No past medical history on file.  There are no active problems to display for this patient.   Past Surgical History:  Procedure Laterality Date  . CHOLECYSTECTOMY      OB History    No data available       Home Medications    Prior to Admission medications   Medication Sig Start Date End Date Taking? Authorizing Provider  HYDROcodone-acetaminophen (NORCO) 5-325 MG tablet Take 1 tablet by mouth every 6 (six) hours as needed for moderate pain. 04/28/17  Yes Irean Hong, MD  naproxen (NAPROSYN) 500 MG tablet Take 1 tablet (500 mg total) by mouth 2 (two) times daily. 03/22/17  Yes Yu, Amy V, PA-C  predniSONE (DELTASONE) 20 MG tablet Two daily with food 09/26/16  Yes Elvina Sidle, MD  ibuprofen (ADVIL,MOTRIN) 600 MG tablet Take 1 tablet (600 mg total) by mouth every 8 (eight) hours as needed. Patient not taking: Reported on 06/12/2017 04/28/17   Irean Hong, MD  methylPREDNISolone (MEDROL DOSEPAK) 4 MG TBPK tablet Take as directed Patient not taking: Reported on 06/12/2017 04/28/17   Irean Hong, MD    Family History No family history on file.  Social History Social History   Tobacco Use  . Smoking status: Never Smoker  . Smokeless  tobacco: Never Used  Substance Use Topics  . Alcohol use: Yes  . Drug use: No     Allergies   Patient has no known allergies.   Review of Systems Review of Systems  All other systems reviewed and are negative.    Physical Exam Updated Vital Signs BP 112/71   Pulse 68   Temp 98.2 F (36.8 C) (Oral)   Resp 15   Ht 5\' 2"  (1.575 m)   Wt 59 kg (130 lb)   LMP 05/29/2017   SpO2 100%   BMI 23.78 kg/m   Physical Exam  Constitutional: She is oriented to person, place, and time. She appears well-developed and well-nourished.  HENT:  Head: Normocephalic and atraumatic.  Eyes: Conjunctivae and EOM are normal. Pupils are equal, round, and reactive to light.  Neck: Normal range of motion and phonation normal. Neck supple.  Cardiovascular: Normal rate and regular rhythm.  Pulmonary/Chest: Effort normal and breath sounds normal. She exhibits no tenderness.  Abdominal: Soft. She exhibits no distension. There is no tenderness. There is no guarding.  Musculoskeletal: Normal range of motion.  Neurological: She is alert and oriented to person, place, and time. She exhibits normal muscle tone.  Skin: Skin is warm and dry.  Psychiatric: She has a normal mood and affect. Her behavior is normal. Judgment and thought content normal.  Nursing note and vitals reviewed.  ED Treatments / Results  Labs (all labs ordered are listed, but only abnormal results are displayed) Labs Reviewed  COMPREHENSIVE METABOLIC PANEL - Abnormal; Notable for the following components:      Result Value   Calcium 8.7 (*)    ALT 12 (*)    All other components within normal limits  URINALYSIS, ROUTINE W REFLEX MICROSCOPIC - Abnormal; Notable for the following components:   APPearance CLOUDY (*)    Leukocytes, UA LARGE (*)    Bacteria, UA MANY (*)    Squamous Epithelial / LPF TOO NUMEROUS TO COUNT (*)    All other components within normal limits  URINE CULTURE  LIPASE, BLOOD  CBC  I-STAT BETA HCG BLOOD,  ED ([facility], [facility], [facility] ONLY)    EKG  EKG Interpretation None       Radiology Ct Abdomen Pelvis W Contrast  Result Date: 06/12/2017 CLINICAL DATA:  Increasing right lower quadrant pain x3 days, nausea/ vomiting, prior cholecystectomy EXAM: CT ABDOMEN AND PELVIS WITH CONTRAST TECHNIQUE: Multidetector CT imaging of the abdomen and pelvis was performed using the standard protocol following bolus administration of intravenous contrast. CONTRAST:  100mL ISOVUE-300 IOPAMIDOL (ISOVUE-300) INJECTION 61% COMPARISON:  None. FINDINGS: Lower chest: Lung bases are clear. Hepatobiliary: Liver is within normal limits. Status post cholecystectomy. No intrahepatic or extrahepatic ductal dilatation. Pancreas: Within normal limits. Spleen: Within normal limits. Adrenals/Urinary Tract: Adrenal glands within normal limits. Kidneys are within normal limits.  No hydronephrosis. Bladder is within normal limits. Stomach/Bowel: Stomach is within normal limits. No evidence of bowel obstruction. Normal appendix (series 3/image 55). Vascular/Lymphatic: No evidence of abdominal aortic aneurysm. Circumaortic left renal vein. No suspicious abdominopelvic lymphadenopathy. Reproductive: Uterus is within normal limits. Bilateral ovaries are within normal limits, noting a right corpus luteal cyst/follicle, physiologic. Other: Small volume pelvic ascites, likely physiologic. Musculoskeletal: Visualized osseous structures are within normal limits. IMPRESSION: No evidence of bowel obstruction.  Normal appendix. Right corpus luteal cyst/follicle, physiologic. However, this could account for the patient's right lower quadrant pain. Small volume pelvic ascites, within normal limits. Electronically Signed   By: Charline BillsSriyesh  Krishnan M.D.   On: 06/12/2017 11:46    Procedures Procedures (including critical care time)  Medications Ordered in ED Medications  iopamidol (ISOVUE-300) 61 % injection (100 mLs  Contrast Given 06/12/17 1118)     Initial  Impression / Assessment and Plan / ED Course  I have reviewed the triage vital signs and the nursing notes.  Pertinent labs & imaging results that were available during my care of the patient were reviewed by me and considered in my medical decision making (see chart for details).      Patient Vitals for the past 24 hrs:  BP Temp Temp src Pulse Resp SpO2 Height Weight  06/12/17 1215 112/71 - - 68 - 100 % - -  06/12/17 1145 113/76 - - 65 - 100 % - -  06/12/17 1141 - - - 84 - 100 % - -  06/12/17 1140 114/73 - - - - - - -  06/12/17 0628 118/70 - - 65 15 100 % - -  06/12/17 0438 115/70 - - 68 15 100 % - -  06/12/17 0054 - - - - - - 5\' 2"  (1.575 m) 59 kg (130 lb)  06/12/17 0043 119/74 98.2 F (36.8 C) Oral 78 14 100 % - -    12:50 PM Reevaluation with update and discussion. After initial assessment and treatment, an updated evaluation reveals that the patient remains comfortable.  She has no further complaints.  Findings discussed with the patient and all questions were answered. Mancel BaleElliott Shanell Aden      Final Clinical Impressions(s) / ED Diagnoses   Final diagnoses:  Right lower quadrant abdominal pain    Nonspecific pain, with possible UTI, culture has been sent.  The patient does not have urinary symptoms, and CT scan is reassuring.  There is no clinical or imaging evidence for pyelonephritis.  Doubt serious bacterial infection, metabolic instability or impending vascular collapse.  Nursing Notes Reviewed/ Care Coordinated Applicable Imaging Reviewed Interpretation of Laboratory Data incorporated into ED treatment  The patient appears reasonably screened and/or stabilized for discharge and I doubt any other medical condition or other [facility] requiring further screening, evaluation, or treatment in the ED at this time prior to discharge.  Plan: Home Medications-OTC analgesia, as needed; Home Treatments-rest, fluids, regular diet; return here if the recommended treatment, does not improve  the symptoms; Recommended follow up-PCP follow-up as needed   ED Discharge Orders    None       Mancel BaleWentz, Harika Laidlaw, MD 06/12/17 1252

## 2017-06-12 NOTE — ED Triage Notes (Signed)
The pt is c/o abd pain with nv no diarrhea since yesterday  lmp 2 weeks ago

## 2017-06-12 NOTE — Discharge Instructions (Signed)
There were no complicating features found on the CT scan which was done today.  For ongoing pain, it is safe to take Tylenol every 4 hours as needed.  Make sure you are drinking plenty of fluids, and eat 3 regular meals each day.  Return here, or see the doctor of your choice, as needed for problems.

## 2017-06-12 NOTE — ED Notes (Signed)
Patient verbalizes understanding of discharge instructions. Opportunity for questioning and answers were provided. 

## 2017-06-13 LAB — URINE CULTURE

## 2017-09-18 ENCOUNTER — Ambulatory Visit (HOSPITAL_COMMUNITY)
Admission: EM | Admit: 2017-09-18 | Discharge: 2017-09-18 | Disposition: A | Discharge status: Home / Self Care | Payer: Self-pay | Attending: Family Medicine | Admitting: Family Medicine

## 2017-09-18 ENCOUNTER — Encounter (HOSPITAL_COMMUNITY): Payer: Self-pay | Admitting: Emergency Medicine

## 2017-09-18 ENCOUNTER — Other Ambulatory Visit: Payer: Self-pay

## 2017-09-18 DIAGNOSIS — M67432 Ganglion, left wrist: Secondary | ICD-10-CM

## 2017-09-18 DIAGNOSIS — M25532 Pain in left wrist: Secondary | ICD-10-CM

## 2017-09-18 DIAGNOSIS — G8929 Other chronic pain: Secondary | ICD-10-CM

## 2017-09-18 MED ORDER — HYDROCODONE-ACETAMINOPHEN 5-325 MG PO TABS: 1.0000 | ORAL_TABLET | Freq: Four times a day (QID) | ORAL | 0 refills | Status: DC | PRN

## 2017-09-18 MED ORDER — METHYLPREDNISOLONE SODIUM SUCC 125 MG IJ SOLR
125.0000 mg | Freq: Once | INTRAMUSCULAR | Status: AC
  Administered 2017-09-18: 125 mg via INTRAMUSCULAR

## 2017-09-18 MED ORDER — NAPROXEN 500 MG PO TABS: 500.0000 mg | ORAL_TABLET | Freq: Two times a day (BID) | ORAL | 1 refills | Status: DC | PRN

## 2017-09-18 MED ORDER — METHYLPREDNISOLONE SODIUM SUCC 125 MG IJ SOLR
INTRAMUSCULAR | Status: AC
  Filled 2017-09-18: qty 2

## 2017-09-18 NOTE — ED Triage Notes (Signed)
C/o left wrist "cyst" swollen and painful. States has been seen here before for same problem

## 2017-09-18 NOTE — Discharge Instructions (Signed)
You were given a shot of SoluMedrol (steroid) today to help with pain and inflammation. May start Naproxen 500mg  twice a day as directed. May wear ace wrap for support and comfort. May take Vicodin 1-2 tablets every 6 hours as needed for severe pain. Recommend follow-up with your Orthopedic within 1 week if pain does not improve.

## 2017-09-18 NOTE — ED Provider Notes (Signed)
[facility]    CSN: [id number] Arrival date & time: 09/18/17  4098     History   Chief Complaint Chief Complaint  Patient presents with  . Wrist Pain    HPI Jordan George is a 22 y.o. female.   22 year old female presents with left wrist pain along with swelling of her ganglion cyst that has become more severe in the past 3 days. She has had issues with left wrist pain caused by repetitive motions from her work as well as formation of a ganglion cyst that began about 1 year ago in March 2018. She continues to have pain daily but will have flare-ups every few months. She has seen an Orthopedic back in Oct 2018 and has taken Naproxen and did PT which did help. She was not able to have surgery yet due to no insurance and unable to be out of work for recovery. Pain will radiate to her fingers as well as some numbness in her 4th and 5th digits. She has been given various treatments for the pain and inflammation including steroid injections, steroid oral taper, Naproxen, Toradol and Hydrocodone for severe pain. She has had little success with oral steroids but other treatments have all helped in the past. She works on the [employer] for [employer] in [place] area and has tried various positions but all require repetitive motions and aggravate her symptoms. She is planning to look for another job within the next month. She has a wrist splint to help with her symptoms but now it causes more pain due to pressure on the enlarged cyst. Otherwise no other chronic health issues. Takes no daily medication.    The history is provided by the patient.    History reviewed. No pertinent past medical history.  There are no active problems to display for this patient.   Past Surgical History:  Procedure Laterality Date  . CHOLECYSTECTOMY      OB History    No data available       Home Medications    Prior to Admission medications   Medication Sig Start Date End Date  Taking? Authorizing Provider  HYDROcodone-acetaminophen (NORCO/VICODIN) 5-325 MG tablet Take 1-2 tablets by mouth every 6 (six) hours as needed for severe pain. 09/18/17   Sudie Grumbling, NP  naproxen (NAPROSYN) 500 MG tablet Take 1 tablet (500 mg total) by mouth 2 (two) times daily as needed for moderate pain. 09/18/17   Sudie Grumbling, NP    Family History No family history on file.  Social History Social History   Tobacco Use  . Smoking status: Never Smoker  . Smokeless tobacco: Never Used  Substance Use Topics  . Alcohol use: Yes  . Drug use: No     Allergies   Patient has no known allergies.   Review of Systems Review of Systems  Constitutional: Negative for chills, fatigue and fever.  Eyes: Negative for photophobia and visual disturbance.  Musculoskeletal: Positive for arthralgias, joint swelling and myalgias.  Skin: Negative for rash and wound.  Allergic/Immunologic: Negative for environmental allergies and immunocompromised state.  Neurological: Positive for numbness. Negative for dizziness, tremors, seizures, syncope, weakness, light-headedness and headaches.  Hematological: Negative for adenopathy. Does not bruise/bleed easily.  Psychiatric/Behavioral: Negative.      Physical Exam Triage Vital Signs ED Triage Vitals  Enc Vitals Group     BP 09/18/17 1015 112/73     Pulse Rate 09/18/17 1015 97     Resp --  Temp 09/18/17 1015 99.4 F (37.4 C)     Temp Source 09/18/17 1015 Oral     SpO2 09/18/17 1015 100 %     Weight 09/18/17 1013 130 lb (59 kg)     Height --      Head Circumference --      Peak Flow --      Pain Score 09/18/17 1013 8     Pain Loc --      Pain Edu? --      Excl. in GC? --    No data found.  Updated Vital Signs BP 112/73 (BP Location: Right Arm)   Pulse 97   Temp 99.4 F (37.4 C) (Oral)   Wt 130 lb (59 kg)   LMP 08/22/2017   SpO2 100%   BMI 23.78 kg/m   Visual Acuity Right Eye Distance:   Left Eye Distance:     Bilateral Distance:    Right Eye Near:   Left Eye Near:    Bilateral Near:     Physical Exam  Constitutional: She is oriented to person, place, and time. She appears well-developed and well-nourished. She is cooperative. She does not appear ill. No distress.  HENT:  Head: Normocephalic and atraumatic.  Eyes: Conjunctivae and EOM are normal.  Neck: Normal range of motion.  Cardiovascular: Normal rate.  Pulmonary/Chest: Effort normal.  Musculoskeletal: She exhibits edema and tenderness.       Left hand: She exhibits tenderness and swelling. She exhibits normal range of motion, normal two-point discrimination and normal capillary refill. Decreased sensation noted. Decreased sensation is present in the medial distribution. Normal strength noted.       Hands: Has full range of motion of hand and wrist but pain with flexion and hyperextension as well as rotation inward. Almost 2cm mobile round cyst present near distal area of left radial bone. Tender. No numbness at rest but Positive Tinel sign and Phalen sign. Good capillary refill. No other neuro deficits noted.   Neurological: She is alert and oriented to person, place, and time. She has normal strength. She displays no atrophy and no tremor. A sensory deficit is present. She exhibits normal muscle tone.  Skin: Skin is warm, dry and intact. Capillary refill takes less than 2 seconds. No rash noted.  Psychiatric: She has a normal mood and affect. Her behavior is normal. Judgment and thought content normal.     UC Treatments / Results  Labs (all labs ordered are listed, but only abnormal results are displayed) Labs Reviewed - No data to display  EKG  EKG Interpretation None       Radiology No results found.  Procedures Procedures (including critical care time)  Medications Ordered in UC Medications  methylPREDNISolone sodium succinate (SOLU-MEDROL) 125 mg/2 mL injection 125 mg (125 mg Intramuscular Given 09/18/17 1052)      Initial Impression / Assessment and Plan / UC Course  I have reviewed the triage vital signs and the nursing notes.  Pertinent labs & imaging results that were available during my care of the patient were reviewed by me and considered in my medical decision making (see chart for details).    Discussed that the ganglion cyst is continuing to cause pressure on the nerve, causing the pain and numbness. Reviewed that she also has pain, possibly from carpal tunnel syndrome which may or may not be associated with the cyst. Gave SoluMedrol 125mg  IM now to help with pain and inflammation. May restart Naproxen 500mg  twice a day  as directed. May wear an ace wrap for support and comfort. For severe pain, take Vicodin 1-2 tablets every 6 hours as needed. Note written to be out of work for 3 days. Strongly encouraged to seek other employment opportunities without repetitive hand motions. Recommend follow-up with [facility] within 1-2 weeks for recheck or sooner if pain does not improve.   Final Clinical Impressions(s) / UC Diagnoses   Final diagnoses:  Chronic pain of left wrist  Ganglion cyst of wrist, left    ED Discharge Orders        Ordered    naproxen (NAPROSYN) 500 MG tablet  2 times daily PRN     09/18/17 1039    HYDROcodone-acetaminophen (NORCO/VICODIN) 5-325 MG tablet  Every 6 hours PRN     09/18/17 1041       Controlled Substance Prescriptions [place] Controlled Substance Registry consulted? Yes, I have consulted the [place] Controlled Substances Registry for this patient, and feel the risk/benefit ratio today is favorable for proceeding with this prescription for a controlled substance. Only active Rx was for Vicodin 5-325mg  in October 2018. No other active Rx.    Sudie Grumbling, NP 09/19/17 1004

## 2017-10-16 ENCOUNTER — Other Ambulatory Visit: Payer: Self-pay

## 2017-10-16 ENCOUNTER — Emergency Department
Admission: EM | Admit: 2017-10-16 | Discharge: 2017-10-16 | Disposition: A | Discharge status: Home / Self Care | Payer: Self-pay | Attending: Emergency Medicine | Admitting: Emergency Medicine

## 2017-10-16 ENCOUNTER — Encounter: Payer: Self-pay | Admitting: Emergency Medicine

## 2017-10-16 DIAGNOSIS — M67432 Ganglion, left wrist: Secondary | ICD-10-CM | POA: Insufficient documentation

## 2017-10-16 DIAGNOSIS — M25532 Pain in left wrist: Secondary | ICD-10-CM | POA: Insufficient documentation

## 2017-10-16 DIAGNOSIS — Z79899 Other long term (current) drug therapy: Secondary | ICD-10-CM | POA: Insufficient documentation

## 2017-10-16 MED ORDER — OXYCODONE-ACETAMINOPHEN 5-325 MG PO TABS
1.0000 | ORAL_TABLET | Freq: Once | ORAL | Status: AC
  Administered 2017-10-16: 1 via ORAL
  Filled 2017-10-16: qty 1

## 2017-10-16 NOTE — ED Provider Notes (Signed)
[facility] Emergency Department Provider Note   ____________________________________________   First MD Initiated Contact with Patient 10/16/17 [phone]     (approximate)  I have reviewed the triage vital signs and the nursing notes.   HISTORY  Chief Complaint Wrist Pain    HPI Jordan George is a 22 y.o. female who comes into the hospital today with some left wrist pain.  The patient has a cyst on her left wrist.  She has had it for about 13 months but it was hurting very badly last night.  She states that she was feeling some pulling on her left index finger and shooting up into her elbow.  The patient has been taking naproxen and Vicodin but could not take it at work.  She states that she did take some ibuprofen but the pain would not go away.  The patient rates her pain a 7 out of 10 in intensity currently.  She reports that she has gone to [facility] but they are unable to do anything until the patient has some insurance.  The patient states that she does occasionally get some numbness and tingling in her left fingers but has none at this time.  She is here for evaluation.  History reviewed. No pertinent past medical history.  There are no active problems to display for this patient.   Past Surgical History:  Procedure Laterality Date  . CHOLECYSTECTOMY      Prior to Admission medications   Medication Sig Start Date End Date Taking? Authorizing Provider  HYDROcodone-acetaminophen (NORCO/VICODIN) 5-325 MG tablet Take 1-2 tablets by mouth every 6 (six) hours as needed for severe pain. 09/18/17   Sudie GrumblingAmyot, Ann Berry, NP  naproxen (NAPROSYN) 500 MG tablet Take 1 tablet (500 mg total) by mouth 2 (two) times daily as needed for moderate pain. 09/18/17   Sudie GrumblingAmyot, Ann Berry, NP    Allergies Patient has no known allergies.  No family history on file.  Social History Social History   Tobacco Use  . Smoking status: Never Smoker  .  Smokeless tobacco: Never Used  Substance Use Topics  . Alcohol use: Yes  . Drug use: No    Review of Systems  Constitutional: No fever/chills Eyes: No visual changes. ENT: No sore throat. Cardiovascular: Denies chest pain. Respiratory: Denies shortness of breath. Gastrointestinal: No abdominal pain.   Genitourinary: Negative for dysuria. Musculoskeletal: Left wrist pain Skin: Negative for rash. Neurological: Negative for headaches, focal weakness or numbness.   ____________________________________________   PHYSICAL EXAM:  VITAL SIGNS: ED Triage Vitals  Enc Vitals Group     BP 10/16/17 0224 122/65     Pulse Rate 10/16/17 0224 96     Resp 10/16/17 0224 16     Temp 10/16/17 0224 98.9 F (37.2 C)     Temp Source 10/16/17 0224 Oral     SpO2 10/16/17 0224 100 %     Weight 10/16/17 0225 130 lb (59 kg)     Height 10/16/17 0225 5\' 2"  (1.575 m)     Head Circumference --      Peak Flow --      Pain Score 10/16/17 0225 7     Pain Loc --      Pain Edu? --      Excl. in GC? --     Constitutional: Alert and oriented. Well appearing and in moderate distress. Eyes: Conjunctivae are normal. PERRL. EOMI. Head: Atraumatic. Nose: No congestion/rhinnorhea. Mouth/Throat: Mucous membranes are moist.  Oropharynx non-erythematous. Cardiovascular: Normal rate, regular rhythm. Grossly normal heart sounds.  Good peripheral circulation. Respiratory: Normal respiratory effort.  No retractions. Lungs CTAB. Gastrointestinal: Soft and nontender. No distention.  Positive bowel sounds Musculoskeletal: Mobile and slightly compressible ganglion cyst noted to left wrist with some mild tenderness to palpation as well as tenderness with flexion of the left wrist.  Sensation and motion intact distally Neurologic:  Normal speech and language.  Skin:  Skin is warm, dry and intact.  Psychiatric: Mood and affect are normal. .  ____________________________________________   LABS (all labs ordered are  listed, but only abnormal results are displayed)  Labs Reviewed - No data to display ____________________________________________  EKG  none ____________________________________________  RADIOLOGY  ED MD interpretation:  none  Official radiology report(s): No results found.  ____________________________________________   PROCEDURES  Procedure(s) performed: None  Procedures  Critical Care performed: No  ____________________________________________   INITIAL IMPRESSION / ASSESSMENT AND PLAN / ED COURSE  As part of my medical decision making, I reviewed the following data within the electronic MEDICAL RECORD NUMBER Notes from prior ED visits and [place] Controlled Substance Database   This is a 22 year old female who comes into the hospital today with a ganglion cyst on her left wrist.  The patient has had it for 13 months but reports that it was tender today.  It does appear that the patient has a ganglion cyst.  I explained to the patient that the best way to treat this is to have it surgically removed.  While she is awaiting her insurance I will give the patient a splint for comfort as well as a dose of Percocet.  I have informed her that she should use the splint while she is at work and she should be working in an area where she does not have to move and flex her wrist repetitively.  Otherwise the patient will be discharged home and referred back to [facility] orthopedic.      ____________________________________________   FINAL CLINICAL IMPRESSION(S) / ED DIAGNOSES  Final diagnoses:  Left wrist pain  Ganglion of left wrist     ED Discharge Orders    None       Note:  This document was prepared using Dragon voice recognition software and may include unintentional dictation errors.    Rebecka Apley, MD 10/16/17 [phone]

## 2017-10-16 NOTE — ED Notes (Addendum)
PT states she has a ganglion cyst on her top left wrist area. Reports that she was at work tonight when the pain got worse and she had to leave. Pt is alert and oriented x 4. Friend at bedside. Dr. Zenda AlpersWebster at bedside.

## 2017-10-16 NOTE — ED Triage Notes (Signed)
Pt reports pain from her ganglion cyst to the top of the left wrist, states that she has had it for 13 months and was at work tonight and the pain got worse, pt states that she left work due to the pain, pt states that she is currently on vicodin and naproxen to treat the pain

## 2017-10-16 NOTE — Discharge Instructions (Addendum)
Please follow-up with [facility] for further evaluation and treatment of her ganglion cyst.  Please use your splint while you are working and while you are performing tasks during the day to help decrease the amount of movement to the left wrist.  Please return with any other concerns or conditions.  Please ensure that you are taking your medication.

## 2018-08-23 IMAGING — CT CT ABD-PELV W/ CM
2 of 4 series · 16 of 46 positions shown, 18 images · IV contrast (APPLIED)
Comparison: None.

CLINICAL DATA: Increasing right lower quadrant pain x3 days,
nausea/ vomiting, prior cholecystectomy

EXAM:
CT ABDOMEN AND PELVIS WITH CONTRAST
TECHNIQUE: Multidetector CT imaging of the abdomen and pelvis was performed
using the standard protocol following bolus administration of
intravenous contrast.
CONTRAST:  100mL [id number] IOPAMIDOL ([id number]) INJECTION 61%

[Series 3: abd/ pelvis 5.0 i30f 2 · axial · 0.70mm/px · z∈[+704,+1090]mm · 13 of 85 slices shown, 15 images]
[im 4/85  soft-tissue]
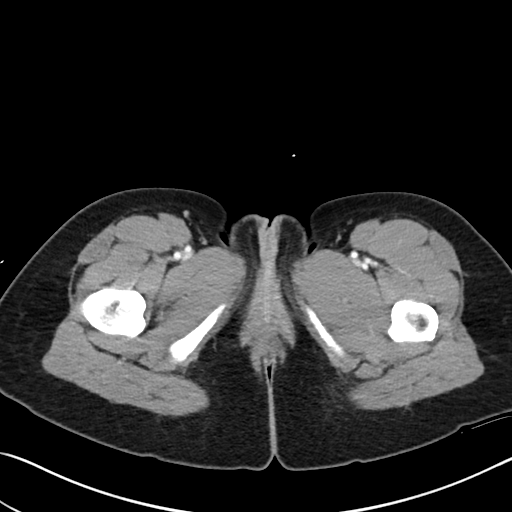
[im 4/85  bone]
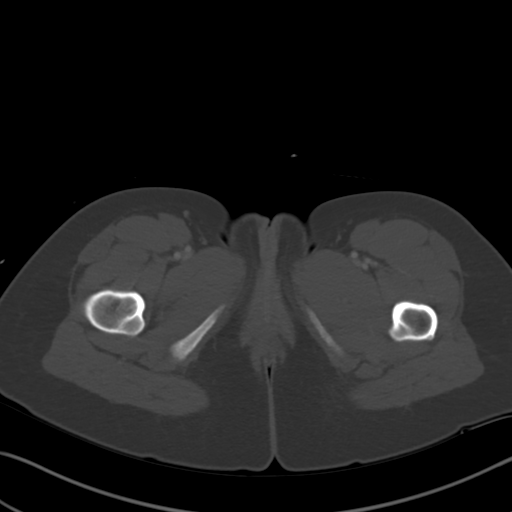
[im 12/85  soft-tissue]
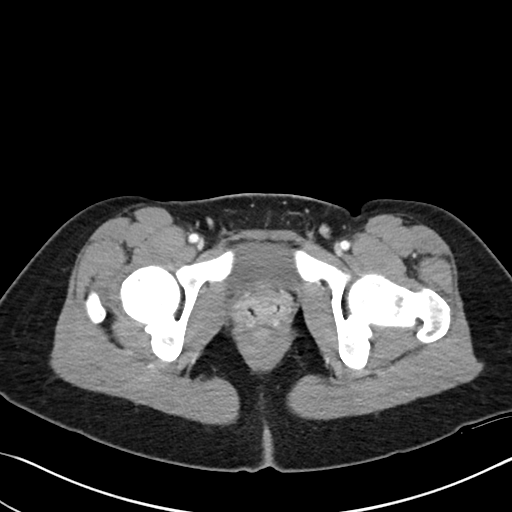
[im 20/85  soft-tissue]
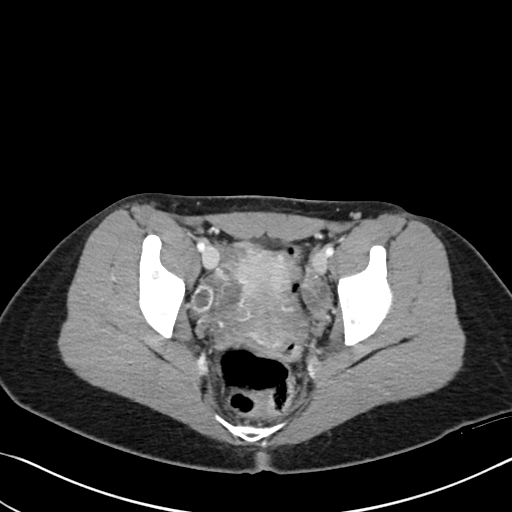
[im 23/85  soft-tissue]
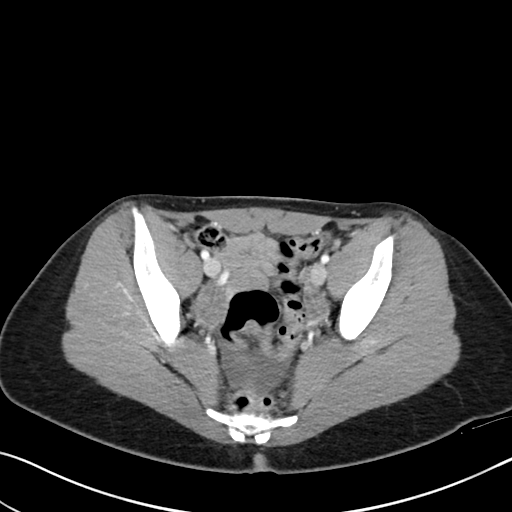
[im 31/85  soft-tissue]
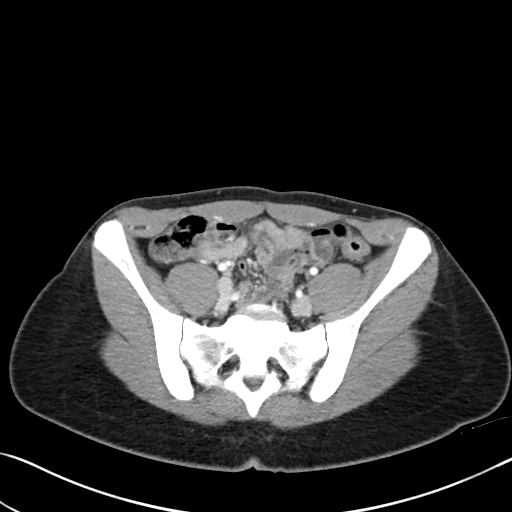
[im 35/85  soft-tissue]
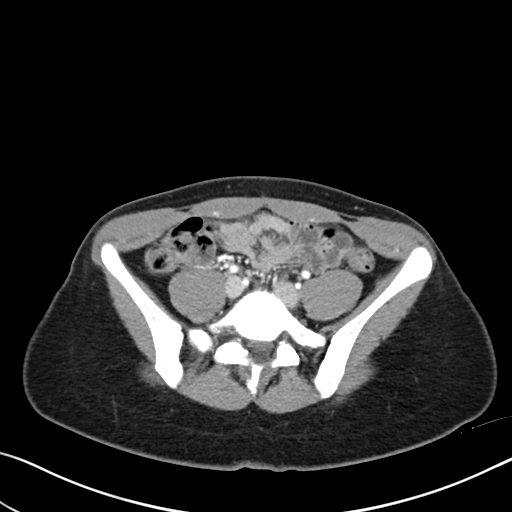
[im 43/85  soft-tissue]
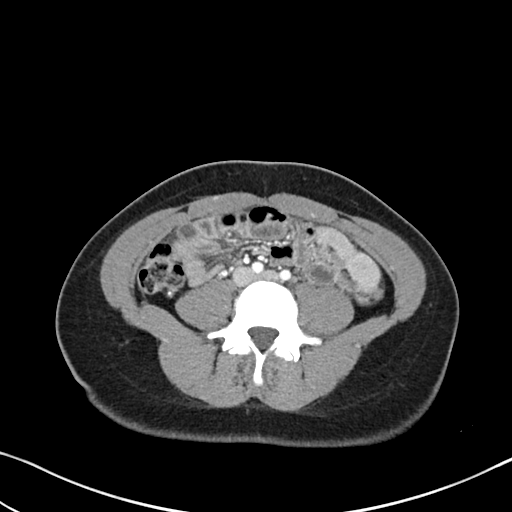
[im 50/85  soft-tissue]
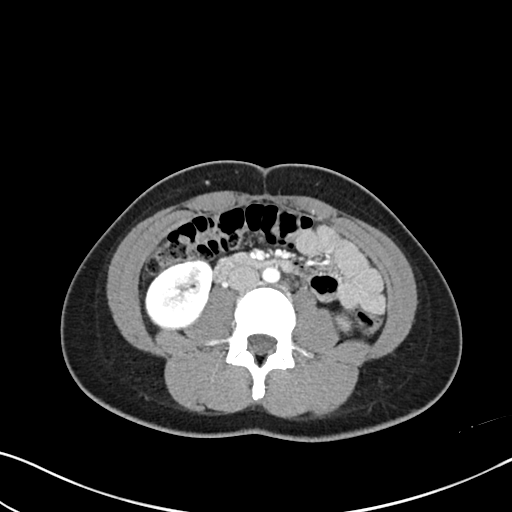
[im 54/85  soft-tissue]
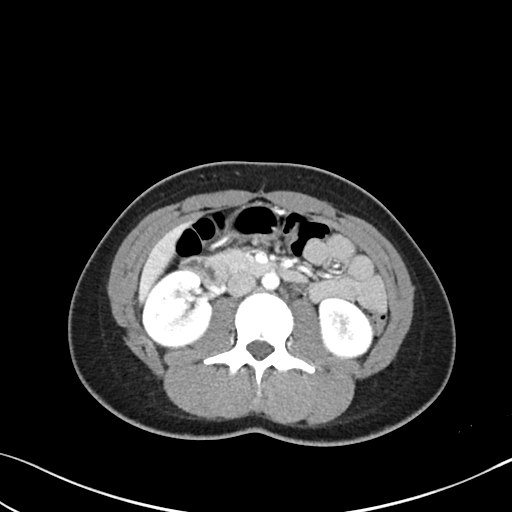
[im 54/85  bone]
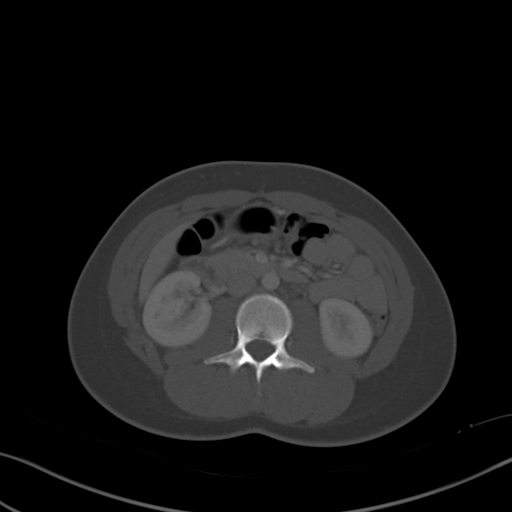
[im 62/85  soft-tissue]
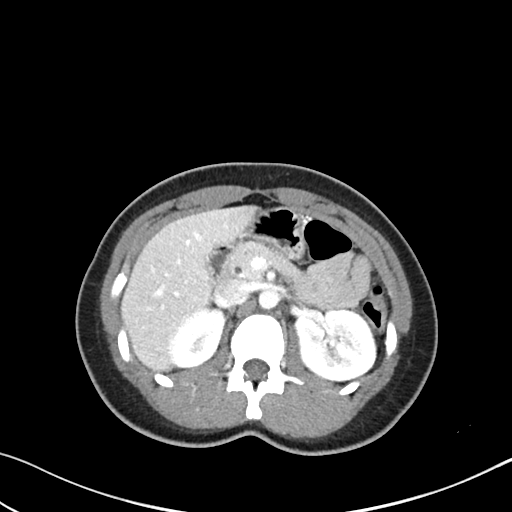
[im 65/85  soft-tissue]
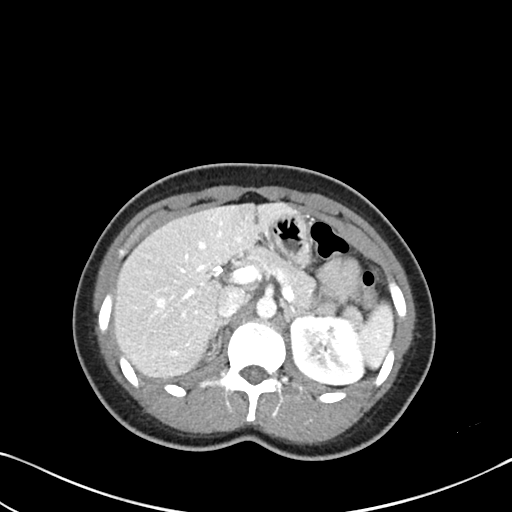
[im 73/85  soft-tissue]
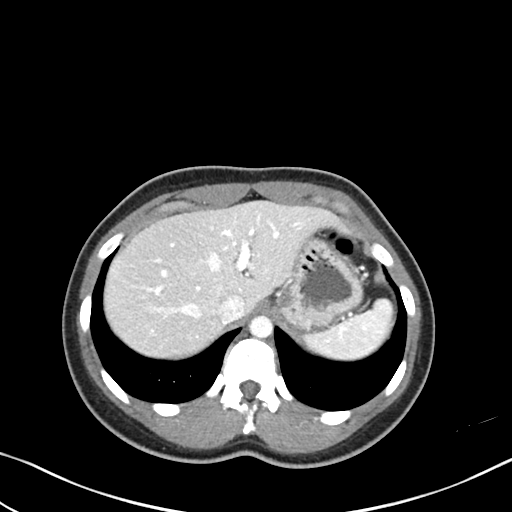
[im 81/85  soft-tissue]
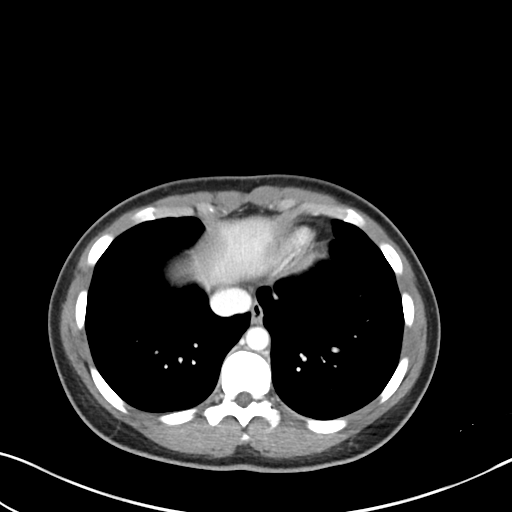

[Series 6: coronal soft tissue · coronal · 0.64mm/px · 3 of 76 slices shown]
[im 26/76  soft-tissue]
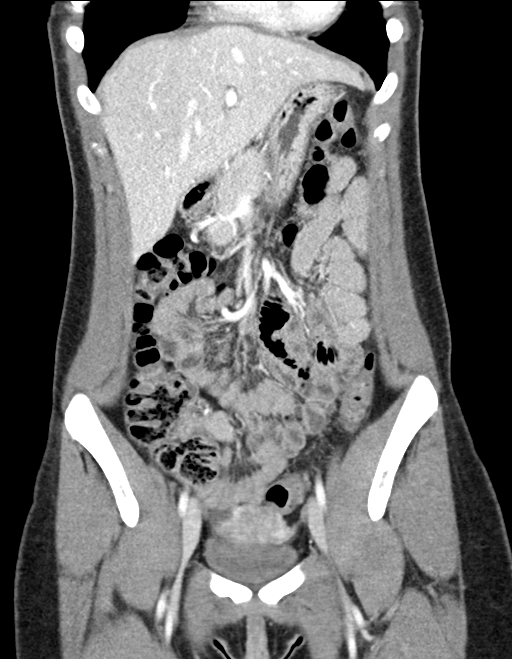
[im 34/76  soft-tissue]
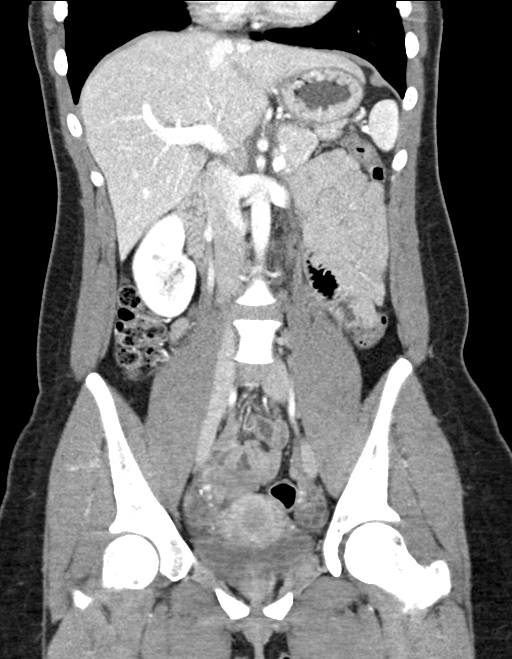
[im 42/76  soft-tissue]
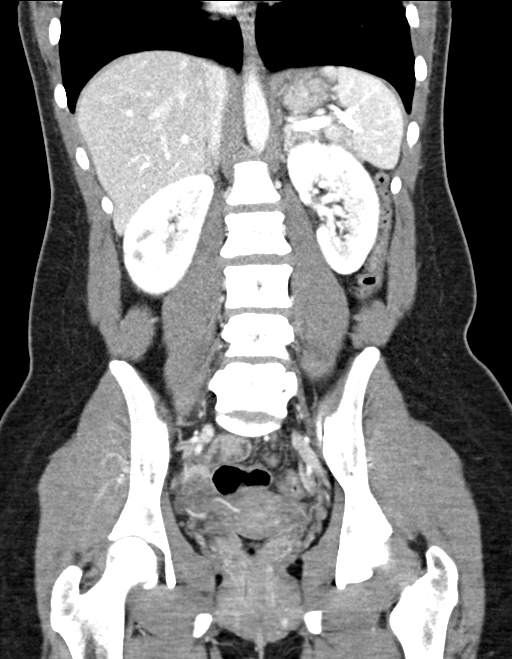

[16 of 46 positions shown; findings below may reference images not displayed]

FINDINGS: Lower chest: Lung bases are clear.

Hepatobiliary: Liver is within normal limits.

Status post cholecystectomy. No intrahepatic or extrahepatic ductal
dilatation.

Pancreas: Within normal limits.

Spleen: Within normal limits.

Adrenals/Urinary Tract: Adrenal glands within normal limits.

Kidneys are within normal limits.  No hydronephrosis.

Bladder is within normal limits.

Stomach/Bowel: Stomach is within normal limits.

No evidence of bowel obstruction.

Normal appendix (series 3/image 55).

Vascular/Lymphatic: No evidence of abdominal aortic aneurysm.

Circumaortic left renal vein.

No suspicious abdominopelvic lymphadenopathy.

Reproductive: Uterus is within normal limits.

Bilateral ovaries are within normal limits, noting a right corpus
luteal cyst/follicle, physiologic.

Other: Small volume pelvic ascites, likely physiologic.

Musculoskeletal: Visualized osseous structures are within normal
limits.
IMPRESSION: No evidence of bowel obstruction.  Normal appendix.

Right corpus luteal cyst/follicle, physiologic. However, this could
account for the patient's right lower quadrant pain.

Small volume pelvic ascites, within normal limits.

## 2018-12-14 ENCOUNTER — Other Ambulatory Visit: Payer: Self-pay

## 2018-12-14 ENCOUNTER — Ambulatory Visit: Payer: Self-pay | Attending: Nurse Practitioner | Admitting: Nurse Practitioner

## 2020-01-14 ENCOUNTER — Other Ambulatory Visit: Payer: Self-pay

## 2020-01-14 ENCOUNTER — Ambulatory Visit (HOSPITAL_COMMUNITY)
Admission: EM | Admit: 2020-01-14 | Discharge: 2020-01-14 | Disposition: A | Discharge status: Home / Self Care | Payer: Medicaid Other | Attending: Family Medicine | Admitting: Family Medicine

## 2020-01-14 ENCOUNTER — Encounter (HOSPITAL_COMMUNITY): Payer: Self-pay

## 2020-01-14 DIAGNOSIS — R112 Nausea with vomiting, unspecified: Secondary | ICD-10-CM | POA: Diagnosis present

## 2020-01-14 DIAGNOSIS — R1110 Vomiting, unspecified: Secondary | ICD-10-CM

## 2020-01-14 DIAGNOSIS — R1012 Left upper quadrant pain: Secondary | ICD-10-CM | POA: Diagnosis not present

## 2020-01-14 DIAGNOSIS — Z20822 Contact with and (suspected) exposure to covid-19: Secondary | ICD-10-CM | POA: Diagnosis not present

## 2020-01-14 DIAGNOSIS — R197 Diarrhea, unspecified: Secondary | ICD-10-CM

## 2020-01-14 DIAGNOSIS — K649 Unspecified hemorrhoids: Secondary | ICD-10-CM

## 2020-01-14 DIAGNOSIS — K644 Residual hemorrhoidal skin tags: Secondary | ICD-10-CM

## 2020-01-14 LAB — POCT URINALYSIS DIP (DEVICE)
Bilirubin Urine: NEGATIVE
Glucose, UA: NEGATIVE mg/dL
Hgb urine dipstick: NEGATIVE
Ketones, ur: 40 mg/dL — AB
Leukocytes,Ua: NEGATIVE
Nitrite: NEGATIVE
Protein, ur: NEGATIVE mg/dL
Specific Gravity, Urine: >=1.03 (ref 1.005–1.030)
Urobilinogen, UA: 0.2 mg/dL (ref 0.0–1.0)
pH: 5.5 (ref 5.0–8.0)

## 2020-01-14 LAB — SARS CORONAVIRUS 2 (TAT 6-24 HRS): SARS Coronavirus 2: NEGATIVE

## 2020-01-14 MED ORDER — ONDANSETRON 4 MG PO TBDP: 4.0000 mg | ORAL_TABLET | Freq: Three times a day (TID) | ORAL | 0 refills | Status: DC | PRN

## 2020-01-14 MED ORDER — ONDANSETRON 4 MG PO TBDP
4.0000 mg | ORAL_TABLET | Freq: Once | ORAL | Status: AC
  Administered 2020-01-14: 11:00:00 4 mg via ORAL

## 2020-01-14 MED ORDER — HYDROCORTISONE (PERIANAL) 2.5 % EX CREA: 1.0000 "application " | TOPICAL_CREAM | Freq: Two times a day (BID) | CUTANEOUS | 0 refills | Status: AC

## 2020-01-14 MED ORDER — ONDANSETRON 4 MG PO TBDP
ORAL_TABLET | ORAL | Status: AC
  Filled 2020-01-14: qty 1

## 2020-01-14 NOTE — ED Triage Notes (Signed)
Pt c/o painful hemorrhoids for approx 2 weeks not improving with sitz baths, creams, witch hazel.  Also c/o general abdominal pain onset yesterday with diarrhea last night and vomiting this morning. And low grade fever of 99 this morning. Pt states she has had bouts of constipation the past several months. States h/o cholecystectomy and effort to eat/drink in accordance with prior surgery.  Denies chills, congestion, runny nose, sore throat, dysuria sx, vaginal discharge.

## 2020-01-14 NOTE — Discharge Instructions (Signed)
Continue sitz baths, may try soaking on warm tea bag Anusol hc cream twice daily Follow up with surgery if symptoms persisting  Zofran for nausea/vomiting Drink plenty of fluids Follow up if not improving or worsening

## 2020-01-15 NOTE — ED Provider Notes (Signed)
[facility]    CSN: [id number] Arrival date & time: 01/14/20  1011      History   Chief Complaint Chief Complaint  Patient presents with   Hemorrhoids   Emesis    HPI Jordan George is a 24 y.o. female presenting today for evaluation of hemorrhoids.  Patient reports that over the past 2 weeks she has had painful hemorrhoids.  She has been using sitz bath's, over-the-counter creams which hazel wipes without relief of symptoms.  Symptoms have flared up and down over the past 2 weeks, but recently have worsened.  She reports over the past 24 hours she has developed abdominal pain and vomiting with diarrhea.  More frequent bowels have flared the hemorrhoids.  She reports last night a sharp intense pain in her left upper quadrant which doubled her over, this has eased off today and is more of a dull pain.  She had a few episodes of nausea and vomiting.  Has had prior cholecystectomy and reports frequent issues with looser stools.  Denies URI symptoms.  Denies blood in stool/vomit.  HPI  History reviewed. No pertinent past medical history.  There are no problems to display for this patient.   Past Surgical History:  Procedure Laterality Date   CHOLECYSTECTOMY      OB History   No obstetric history on file.      Home Medications    Prior to Admission medications   Medication Sig Start Date End Date Taking? Authorizing Provider  hydrocortisone (ANUSOL-HC) 2.5 % rectal cream Place 1 application rectally 2 (two) times daily. 01/14/20   Marrio Scribner C, PA-C  ondansetron (ZOFRAN ODT) 4 MG disintegrating tablet Take 1 tablet (4 mg total) by mouth every 8 (eight) hours as needed for nausea or vomiting. 01/14/20   Ofilia Rayon, Junius Creamer, PA-C    Family History Family History  Problem Relation Age of Onset   Healthy Mother    Healthy Father     Social History Social History   Tobacco Use   Smoking status: Never Smoker   Smokeless tobacco: Never Used    Substance Use Topics   Alcohol use: Yes   Drug use: No     Allergies   Patient has no known allergies.   Review of Systems Review of Systems  Constitutional: Negative for activity change, appetite change, chills, fatigue and fever.  HENT: Negative for congestion, ear pain, rhinorrhea, sinus pressure, sore throat and trouble swallowing.   Eyes: Negative for discharge and redness.  Respiratory: Negative for cough, chest tightness and shortness of breath.   Cardiovascular: Negative for chest pain.  Gastrointestinal: Positive for abdominal pain, diarrhea, nausea, rectal pain and vomiting. Negative for blood in stool.  Musculoskeletal: Negative for myalgias.  Skin: Negative for rash.  Neurological: Negative for dizziness, light-headedness and headaches.     Physical Exam Triage Vital Signs ED Triage Vitals [01/14/20 1049]  Enc Vitals Group     BP (!) 113/99     Pulse Rate 78     Resp 18     Temp 98.7 F (37.1 C)     Temp Source Oral     SpO2 100 %     Weight      Height      Head Circumference      Peak Flow      Pain Score 9     Pain Loc      Pain Edu?      Excl. in GC?  No data found.  Updated Vital Signs BP (!) 113/99 (BP Location: Left Arm)    Pulse 78    Temp 98.7 F (37.1 C) (Oral)    Resp 18    LMP 12/29/2019    SpO2 100%   Visual Acuity Right Eye Distance:   Left Eye Distance:   Bilateral Distance:    Right Eye Near:   Left Eye Near:    Bilateral Near:     Physical Exam Vitals and nursing note reviewed.  Constitutional:      Appearance: She is well-developed.     Comments: No acute distress  HENT:     Head: Normocephalic and atraumatic.     Nose: Nose normal.  Eyes:     Conjunctiva/sclera: Conjunctivae normal.  Cardiovascular:     Rate and Rhythm: Normal rate.  Pulmonary:     Effort: Pulmonary effort is normal. No respiratory distress.  Abdominal:     General: There is no distension.     Comments: Soft, nondistended, tenderness to  palpation to epigastrium and left upper quadrant negative rebound, negative Rovsing, negative McBurney's  Genitourinary:    Comments: Rectum with moderate 3 cm nonthrombosed hemorrhoid with softer nonflared hemorrhoid proximal to this area, no surrounding erythema induration or fluctuance no masses or tenderness palpated on DRE Musculoskeletal:        General: Normal range of motion.     Cervical back: Neck supple.  Skin:    General: Skin is warm and dry.  Neurological:     Mental Status: She is alert and oriented to person, place, and time.      UC Treatments / Results  Labs (all labs ordered are listed, but only abnormal results are displayed) Labs Reviewed  POCT URINALYSIS DIP (DEVICE) - Abnormal; Notable for the following components:      Result Value   Ketones, ur 40 (*)    All other components within normal limits  SARS CORONAVIRUS 2 (TAT 6-24 HRS)    EKG   Radiology No results found.  Procedures Procedures (including critical care time)  Medications Ordered in UC Medications  ondansetron (ZOFRAN-ODT) disintegrating tablet 4 mg (4 mg Oral Given 01/14/20 1058)    Initial Impression / Assessment and Plan / UC Course  I have reviewed the triage vital signs and the nursing notes.  Pertinent labs & imaging results that were available during my care of the patient were reviewed by me and considered in my medical decision making (see chart for details).     Patient with nonthrombosed hemorrhoid, treating with Anusol HC cream, discussed further at home remedies.  Follow-up with [facility] if symptoms persisting.  Discussed possible triggers regarding bowel patterns.  Nausea vomiting and diarrhea likely viral etiology, pain has subsided and negative peritoneal signs on exam today.  Recommending symptomatic and supportive care with close monitoring.  Discussed strict return precautions. Patient verbalized understanding and is agreeable with plan.  Final Clinical  Impressions(s) / UC Diagnoses   Final diagnoses:  Nausea vomiting and diarrhea  External hemorrhoid     Discharge Instructions     Continue sitz baths, may try soaking on warm tea bag Anusol hc cream twice daily Follow up with surgery if symptoms persisting  Zofran for nausea/vomiting Drink plenty of fluids Follow up if not improving or worsening   ED Prescriptions    Medication Sig Dispense Auth. Provider   hydrocortisone (ANUSOL-HC) 2.5 % rectal cream Place 1 application rectally 2 (two) times daily. 30 g Santia Labate,  Eldra Word C, PA-C   ondansetron (ZOFRAN ODT) 4 MG disintegrating tablet Take 1 tablet (4 mg total) by mouth every 8 (eight) hours as needed for nausea or vomiting. 20 tablet Murlin Schrieber, [place] C, PA-C     PDMP not reviewed this encounter.   Lew Dawes, [place] 01/15/20 1545

## 2020-04-28 ENCOUNTER — Other Ambulatory Visit: Payer: Self-pay

## 2020-04-28 ENCOUNTER — Encounter (HOSPITAL_COMMUNITY): Payer: Self-pay

## 2020-04-28 ENCOUNTER — Emergency Department (HOSPITAL_COMMUNITY)
Admission: EM | Admit: 2020-04-28 | Discharge: 2020-04-28 | Disposition: A | Discharge status: Home / Self Care | Payer: Self-pay | Attending: Emergency Medicine | Admitting: Emergency Medicine

## 2020-04-28 DIAGNOSIS — M25532 Pain in left wrist: Secondary | ICD-10-CM | POA: Insufficient documentation

## 2020-04-28 HISTORY — DX: Enthesopathy, unspecified: M77.9

## 2020-04-28 HISTORY — DX: Ganglion, unspecified site: M67.40

## 2020-04-28 HISTORY — DX: Carpal tunnel syndrome, unspecified upper limb: G56.00

## 2020-04-28 MED ORDER — METHYLPREDNISOLONE SODIUM SUCC 125 MG IJ SOLR
125.0000 mg | Freq: Once | INTRAMUSCULAR | Status: AC
  Administered 2020-04-28: 125 mg via INTRAMUSCULAR
  Filled 2020-04-28: qty 2

## 2020-04-28 NOTE — Progress Notes (Signed)
Orthopedic Tech Progress Note Patient Details:  Jordan George [facility] 1996/03/07 [id number]  Ortho Devices Type of Ortho Device: Velcro wrist forearm splint Ortho Device/Splint Location: left Ortho Device/Splint Interventions: Application   Post Interventions Patient Tolerated: Well Instructions Provided: Care of device   Saul Fordyce 04/28/2020, 10:22 AM

## 2020-04-28 NOTE — ED Provider Notes (Signed)
[facility] EMERGENCY DEPARTMENT Provider Note  CSN: [id number] Arrival date & time: 04/28/20 0845    History Chief Complaint  Patient presents with  . Wrist Pain    HPI  Jordan George is a 24 y.o. female with long history of L wrist ganglion cyst and tendonitis who was doing well until recently when she went to work at a [employer] working with her hands. She reports pain in dorsal L wrist and occasional tingling in L 4th and 5th fingers. No acute injury. States she has been given IM and PO steroids in the past. Has not followed up with Orthopedics.    Past Medical History:  Diagnosis Date  . Carpal tunnel syndrome   . Ganglion cyst   . Tendonitis     Past Surgical History:  Procedure Laterality Date  . CHOLECYSTECTOMY      Family History  Problem Relation Age of Onset  . Healthy Mother   . Healthy Father     Social History   Tobacco Use  . Smoking status: Never Smoker  . Smokeless tobacco: Never Used  Vaping Use  . Vaping Use: Never used  Substance Use Topics  . Alcohol use: Yes  . Drug use: No     Home Medications Prior to Admission medications   Medication Sig Start Date End Date Taking? Authorizing Provider  hydrocortisone (ANUSOL-HC) 2.5 % rectal cream Place 1 application rectally 2 (two) times daily. 01/14/20   Wieters, Hallie C, PA-C  ondansetron (ZOFRAN ODT) 4 MG disintegrating tablet Take 1 tablet (4 mg total) by mouth every 8 (eight) hours as needed for nausea or vomiting. 01/14/20   Wieters, Hallie C, PA-C     Allergies    Patient has no known allergies.   Review of Systems   Review of Systems A comprehensive review of systems was completed and negative except as noted in HPI.    Physical Exam BP 123/84 (BP Location: Left Arm)   Pulse 83   Temp 98.6 F (37 C) (Oral)   Resp 16   Ht 5\' 3"  (1.6 m)   Wt 57.2 kg   LMP 04/05/2020 (Approximate)   SpO2 100%   BMI 22.32 kg/m   Physical Exam Vitals and nursing note  reviewed.  HENT:     Head: Normocephalic.     Nose: Nose normal.  Eyes:     Extraocular Movements: Extraocular movements intact.  Pulmonary:     Effort: Pulmonary effort is normal.  Musculoskeletal:        General: Tenderness (L dorsal wrist) present. No swelling or deformity. Normal range of motion.     Cervical back: Neck supple.  Skin:    Findings: No rash (on exposed skin).  Neurological:     Mental Status: She is alert and oriented to person, place, and time.  Psychiatric:        Mood and Affect: Mood normal.      ED Results / Procedures / Treatments   Labs (all labs ordered are listed, but only abnormal results are displayed) Labs Reviewed - No data to display  EKG None  Radiology No results found.  Procedures Procedures  Medications Ordered in the ED Medications  methylPREDNISolone sodium succinate (SOLU-MEDROL) 125 mg/2 mL injection 125 mg (has no administration in time range)     MDM Rules/Calculators/A&P MDM Patient with exacerbation of chronic L wrist pain, some symptoms concerning for ulnar nerve involvement. No indication for imaging in ED. Will place in wrist brace,  IM steroids as this has helped in the past and referral to Hand.  ED Course  I have reviewed the triage vital signs and the nursing notes.  Pertinent labs & imaging results that were available during my care of the patient were reviewed by me and considered in my medical decision making (see chart for details).     Final Clinical Impression(s) / ED Diagnoses Final diagnoses:  Left wrist pain    Rx / DC Orders ED Discharge Orders    None       Pollyann Savoy, MD 04/28/20 1003

## 2020-04-28 NOTE — ED Triage Notes (Signed)
Patient reports that she has a known ganglion cyst and tendonitis of the left wrist. Patient states she has been taking OTC pain meds with no relief. Patient added that she has numbness of the ring and pinky fingers since last night.

## 2021-09-19 ENCOUNTER — Other Ambulatory Visit: Payer: Self-pay

## 2021-09-19 ENCOUNTER — Encounter (HOSPITAL_COMMUNITY): Payer: Self-pay

## 2021-09-19 ENCOUNTER — Emergency Department (HOSPITAL_COMMUNITY)
Admission: EM | Admit: 2021-09-19 | Discharge: 2021-09-19 | Disposition: A | Discharge status: Home / Self Care | Payer: Self-pay | Attending: Emergency Medicine | Admitting: Emergency Medicine

## 2021-09-19 ENCOUNTER — Emergency Department (HOSPITAL_COMMUNITY): Payer: Self-pay

## 2021-09-19 DIAGNOSIS — J40 Bronchitis, not specified as acute or chronic: Secondary | ICD-10-CM | POA: Insufficient documentation

## 2021-09-19 MED ORDER — FLUTICASONE PROPIONATE 50 MCG/ACT NA SUSP: 2.0000 | Freq: Every day | NASAL | 0 refills | Status: AC

## 2021-09-19 MED ORDER — PREDNISONE 10 MG PO TABS: 20.0000 mg | ORAL_TABLET | Freq: Every day | ORAL | 0 refills | Status: AC

## 2021-09-19 MED ORDER — PROMETHAZINE-DM 6.25-15 MG/5ML PO SYRP: 5.0000 mL | ORAL_SOLUTION | Freq: Four times a day (QID) | ORAL | 0 refills | Status: AC | PRN

## 2021-09-19 NOTE — ED Triage Notes (Signed)
Pt reports having the flu about 2 months ago and has had cough, chills, body aches, and sore throat since then. Pt reports these symptoms have progressively become worse. ?

## 2021-09-19 NOTE — ED Provider Notes (Signed)
?[facility]-EMERGENCY DEPT ?Provider Note ? ? ?CSN: [id number] ?Arrival date & time: 09/19/21  4332 ? ?  ? ?History ? ?Chief Complaint  ?Patient presents with  ? Cough  ? Sore Throat  ? ? ?Jordan George is a 26 y.o. female. ? ? ?Cough ?Sore Throat ?Patient is a 26 year old female with past medical history notable for tinnitus, carpal tunnel ? ?Patient is present emergency room today with complaints of ongoing cough congestion fatigue for 2 months she was originally diagnosed with influenza but states that she felt like she never got completely better.  She denies any fevers since 2 months ago when she first had influenza.  She denies any chest pain but states that she feels somewhat short of breath.  No lightheadedness or dizziness no nausea vomiting or diarrhea. ? ?No recent surgeries, hospitalization, long travel, hemoptysis, estrogen containing OCP, cancer history.  No unilateral leg swelling.  No history of PE or VTE.  ? ?  ? ?Home Medications ?Prior to Admission medications   ?Medication Sig Start Date End Date Taking? Authorizing Provider  ?fluticasone (FLONASE) 50 MCG/ACT nasal spray Place 2 sprays into both nostrils daily for 14 days. 09/19/21 10/03/21 Yes Keila Turan, Rodrigo Ran, PA  ?predniSONE (DELTASONE) 10 MG tablet Take 2 tablets (20 mg total) by mouth daily for 5 days. 09/19/21 09/24/21 Yes Mycala Warshawsky, Rodrigo Ran, PA  ?promethazine-dextromethorphan (PROMETHAZINE-DM) 6.25-15 MG/5ML syrup Take 5 mLs by mouth 4 (four) times daily as needed for cough. 09/19/21  Yes Zeda Gangwer, Rodrigo Ran, PA  ?hydrocortisone (ANUSOL-HC) 2.5 % rectal cream Place 1 application rectally 2 (two) times daily. 01/14/20   Wieters, Hallie C, PA-C  ?ondansetron (ZOFRAN ODT) 4 MG disintegrating tablet Take 1 tablet (4 mg total) by mouth every 8 (eight) hours as needed for nausea or vomiting. 01/14/20   Wieters, Hallie C, PA-C  ?   ? ?Allergies    ?Patient has no known allergies.   ? ?Review of Systems   ?Review of Systems   ?Respiratory:  Positive for cough.   ? ?Physical Exam ?Updated Vital Signs ?BP 125/69 (BP Location: Right Arm)   Pulse 80   Temp 98.3 ?F (36.8 ?C) (Oral)   Resp 18   LMP 09/05/2021   SpO2 100%  ?Physical Exam ?Vitals and nursing note reviewed.  ?Constitutional:   ?   General: She is not in acute distress. ?   Comments: Pleasant well-appearing 26 year old.  In no acute distress.  Sitting comfortably in bed.  Able answer questions appropriately follow commands. No increased work of breathing. Speaking in full sentences.   ?HENT:  ?   Head: Normocephalic and atraumatic.  ?   Nose: Nose normal.  ?   Mouth/Throat:  ?   Mouth: Mucous membranes are moist.  ?   Comments: Cobblestoning present in posterior pharynx ?Eyes:  ?   General: No scleral icterus. ?Cardiovascular:  ?   Rate and Rhythm: Normal rate and regular rhythm.  ?   Pulses: Normal pulses.  ?   Heart sounds: Normal heart sounds.  ?Pulmonary:  ?   Effort: Pulmonary effort is normal. No respiratory distress.  ?   Breath sounds: No wheezing.  ?Abdominal:  ?   Palpations: Abdomen is soft.  ?   Tenderness: There is no abdominal tenderness. There is no guarding or rebound.  ?Musculoskeletal:  ?   Cervical back: Normal range of motion.  ?   Right lower leg: No edema.  ?   Left lower leg: No edema.  ?  Comments: No lower extremity edema or calf tenderness  ?Skin: ?   General: Skin is warm and dry.  ?   Capillary Refill: Capillary refill takes less than 2 seconds.  ?Neurological:  ?   Mental Status: She is alert. Mental status is at baseline.  ?Psychiatric:     ?   Mood and Affect: Mood normal.     ?   Behavior: Behavior normal.  ? ? ?ED Results / Procedures / Treatments   ?Labs ?(all labs ordered are listed, but only abnormal results are displayed) ?Labs Reviewed - No data to display ? ?EKG ?None ? ?Radiology ?DG Chest 2 View ? ?Result Date: 09/19/2021 ?CLINICAL DATA:  Cough, chills, and body aches for 2 months. EXAM: CHEST - 2 VIEW COMPARISON:  None. FINDINGS: The  heart size and mediastinal contours are within normal limits. Both lungs are clear. The visualized skeletal structures are unremarkable. IMPRESSION: No active cardiopulmonary disease. Electronically Signed   By: Danae Orleans M.D.   On: 09/19/2021 10:36   ? ?Procedures ?Procedures  ? ? ?Medications Ordered in ED ?Medications - No data to display ? ?ED Course/ Medical Decision Making/ A&P ?  ?                        ?Medical Decision Making ?Amount and/or Complexity of Data Reviewed ?Radiology: ordered. ? ?Risk ?Prescription drug management. ? ? ?Patient is well-appearing 26 year old female presented emergency room today with complaints of ongoing cough congestion fatigue for the past 2 months since she was a gnosis of influenza.  She denies any chest pain feels that she is not breathing as well she has historically.  Seems that she is having sore throat which is her primary concern on further questioning. ? ?Physical exam unremarkable apart from some faint cobblestoning posterior pharynx. ? ?Otherwise well-appearing.  Vital signs are normal meds. ? ?Patient is PERC negative I have low suspicion for PE.  I suspect her symptoms are related to a viral illness.  Will discharge home with Flonase, prednisone for bronchitis and cough medicine.  Recommend warm tea and honey. ? ?Return precautions given.  I specifically and individually viewed chest x-ray and interpreted results I agree with radiology read this is a unremarkable x-ray with no evidence of pneumonia no infiltrates no other abnormalities. ? ?Final Clinical Impression(s) / ED Diagnoses ?Final diagnoses:  ?Bronchitis  ? ? ?Rx / DC Orders ?ED Discharge Orders   ? ?      Ordered  ?  fluticasone (FLONASE) 50 MCG/ACT nasal spray  Daily       ? 09/19/21 1045  ?  predniSONE (DELTASONE) 10 MG tablet  Daily       ? 09/19/21 1045  ?  promethazine-dextromethorphan (PROMETHAZINE-DM) 6.25-15 MG/5ML syrup  4 times daily PRN       ? 09/19/21 1045  ? ?  ?  ? ?  ? ? ?  ?Gailen Shelter, [place] ?09/19/21 1253 ? ?  ?Derwood Kaplan, MD ?09/20/21 1047 ? ?

## 2021-09-19 NOTE — Discharge Instructions (Signed)
Your symptoms are consistent with a viral illness at this point with 2 months of symptoms I think you would probably benefit from consistent use of some strong anti-inflammatories I recommend Tylenol and ibuprofen.  Please take as instructed below.  I have also given you a few days of prednisone.  Please take as prescribed, drink plenty of water, follow-up with a primary care provider.  Return to the ER for any new or concerning symptoms. ? ?Please use Tylenol or ibuprofen for pain.  You may use 600 mg ibuprofen every 6 hours or 1000 mg of Tylenol every 6 hours.  You may choose to alternate between the 2.  This would be most effective.  Not to exceed 4 g of Tylenol within 24 hours.  Not to exceed 3200 mg ibuprofen 24 hours.  ? ?Use of Flonase I prescribed you I also recommend doing Nettie pot/warm saline gargles ? ?Viral Illness ?TREATMENT  ?Treatment is directed at relieving symptoms. There is no cure. Antibiotics are not effective, because the infection is caused by a virus, not by bacteria. Treatment may include:  ?Increased fluid intake. Sports drinks offer valuable electrolytes, sugars, and fluids.  ?Breathing heated mist or steam (vaporizer or shower).  ?Eating chicken soup or other clear broths, and maintaining good nutrition.  ?Getting plenty of rest.  ?Using gargles or lozenges for comfort.  ?Increasing usage of your inhaler if you have asthma.  ?Return to work when your temperature has returned to normal.  ?Gargle warm salt water and spit it out for sore throat. Take benadryl to decrease sinus secretions. Continue to alternate between Tylenol and ibuprofen for pain and fever control. ? ?Follow Up: Follow up with your primary care doctor in 5-7 days for recheck of ongoing symptoms.  Return to emergency department for emergent changing or worsening of symptoms.  ?

## 2022-05-24 ENCOUNTER — Ambulatory Visit: Payer: Medicaid Other | Admitting: Certified Nurse Midwife

## 2022-05-24 DIAGNOSIS — Z3009 Encounter for other general counseling and advice on contraception: Secondary | ICD-10-CM

## 2022-05-24 DIAGNOSIS — Z01419 Encounter for gynecological examination (general) (routine) without abnormal findings: Secondary | ICD-10-CM

## 2022-06-10 DIAGNOSIS — Z419 Encounter for procedure for purposes other than remedying health state, unspecified: Secondary | ICD-10-CM | POA: Diagnosis not present

## 2022-07-11 DIAGNOSIS — Z419 Encounter for procedure for purposes other than remedying health state, unspecified: Secondary | ICD-10-CM | POA: Diagnosis not present

## 2022-08-11 DIAGNOSIS — Z419 Encounter for procedure for purposes other than remedying health state, unspecified: Secondary | ICD-10-CM | POA: Diagnosis not present

## 2022-09-08 ENCOUNTER — Encounter (HOSPITAL_COMMUNITY): Payer: Self-pay

## 2022-09-08 ENCOUNTER — Other Ambulatory Visit: Payer: Self-pay

## 2022-09-08 ENCOUNTER — Emergency Department (HOSPITAL_COMMUNITY)
Admission: EM | Admit: 2022-09-08 | Discharge: 2022-09-08 | Disposition: A | Discharge status: Home / Self Care | Payer: Medicaid Other | Attending: Emergency Medicine | Admitting: Emergency Medicine

## 2022-09-08 ENCOUNTER — Emergency Department (HOSPITAL_COMMUNITY): Payer: Medicaid Other

## 2022-09-08 DIAGNOSIS — R059 Cough, unspecified: Secondary | ICD-10-CM | POA: Diagnosis not present

## 2022-09-08 DIAGNOSIS — D72829 Elevated white blood cell count, unspecified: Secondary | ICD-10-CM | POA: Diagnosis not present

## 2022-09-08 DIAGNOSIS — J209 Acute bronchitis, unspecified: Secondary | ICD-10-CM | POA: Diagnosis not present

## 2022-09-08 DIAGNOSIS — R0602 Shortness of breath: Secondary | ICD-10-CM | POA: Diagnosis not present

## 2022-09-08 DIAGNOSIS — R062 Wheezing: Secondary | ICD-10-CM | POA: Diagnosis not present

## 2022-09-08 DIAGNOSIS — Z87891 Personal history of nicotine dependence: Secondary | ICD-10-CM | POA: Insufficient documentation

## 2022-09-08 DIAGNOSIS — J9801 Acute bronchospasm: Secondary | ICD-10-CM | POA: Diagnosis not present

## 2022-09-08 DIAGNOSIS — J40 Bronchitis, not specified as acute or chronic: Secondary | ICD-10-CM | POA: Diagnosis not present

## 2022-09-08 DIAGNOSIS — R0789 Other chest pain: Secondary | ICD-10-CM | POA: Diagnosis not present

## 2022-09-08 LAB — CBC
HCT: 42.9 % (ref 36.0–46.0)
Hemoglobin: 14 g/dL (ref 12.0–15.0)
MCH: 28.9 pg (ref 26.0–34.0)
MCHC: 32.6 g/dL (ref 30.0–36.0)
MCV: 88.6 fL (ref 80.0–100.0)
Platelets: 299 10*3/uL (ref 150–400)
RBC: 4.84 MIL/uL (ref 3.87–5.11)
RDW: 12.9 % (ref 11.5–15.5)
WBC: 12.5 10*3/uL — ABNORMAL HIGH (ref 4.0–10.5)
nRBC: 0 % (ref 0.0–0.2)

## 2022-09-08 LAB — BASIC METABOLIC PANEL
Anion gap: 10 (ref 5–15)
BUN: 9 mg/dL (ref 6–20)
CO2: 20 mmol/L — ABNORMAL LOW (ref 22–32)
Calcium: 9.1 mg/dL (ref 8.9–10.3)
Chloride: 107 mmol/L (ref 98–111)
Creatinine, Ser: 0.82 mg/dL (ref 0.44–1.00)
GFR, Estimated: >60 mL/min (ref >60)
Glucose, Bld: 91 mg/dL (ref 70–99)
Potassium: 3.4 mmol/L — ABNORMAL LOW (ref 3.5–5.1)
Sodium: 137 mmol/L (ref 135–145)

## 2022-09-08 LAB — I-STAT BETA HCG BLOOD, ED (MC, WL, AP ONLY): I-stat hCG, quantitative: <5 m[IU]/mL (ref <5)

## 2022-09-08 MED ORDER — ONDANSETRON HCL 4 MG/2ML IJ SOLN
4.0000 mg | Freq: Once | INTRAMUSCULAR | Status: AC
  Administered 2022-09-08: 4 mg via INTRAVENOUS
  Filled 2022-09-08: qty 2

## 2022-09-08 MED ORDER — PREDNISONE 50 MG PO TABS: 50.0000 mg | ORAL_TABLET | Freq: Every day | ORAL | 0 refills | Status: DC

## 2022-09-08 MED ORDER — SODIUM CHLORIDE 0.9 % IV SOLN
1000.0000 mL | INTRAVENOUS | Status: DC
  Administered 2022-09-08: 1000 mL via INTRAVENOUS

## 2022-09-08 MED ORDER — IPRATROPIUM-ALBUTEROL 0.5-2.5 (3) MG/3ML IN SOLN
3.0000 mL | Freq: Once | RESPIRATORY_TRACT | Status: AC
  Administered 2022-09-08: 3 mL via RESPIRATORY_TRACT
  Filled 2022-09-08: qty 3

## 2022-09-08 MED ORDER — ALBUTEROL SULFATE HFA 108 (90 BASE) MCG/ACT IN AERS: 1.0000 | INHALATION_SPRAY | Freq: Four times a day (QID) | RESPIRATORY_TRACT | 0 refills | Status: DC | PRN

## 2022-09-08 MED ORDER — SODIUM CHLORIDE 0.9 % IV BOLUS (SEPSIS)
1000.0000 mL | Freq: Once | INTRAVENOUS | Status: AC
  Administered 2022-09-08: 1000 mL via INTRAVENOUS

## 2022-09-08 MED ORDER — BENZONATATE 100 MG PO CAPS: 100.0000 mg | ORAL_CAPSULE | Freq: Three times a day (TID) | ORAL | 0 refills | Status: DC

## 2022-09-08 MED ORDER — METHYLPREDNISOLONE SODIUM SUCC 125 MG IJ SOLR
125.0000 mg | Freq: Once | INTRAMUSCULAR | Status: AC
  Administered 2022-09-08: 125 mg via INTRAVENOUS
  Filled 2022-09-08: qty 2

## 2022-09-08 MED ORDER — MAGNESIUM SULFATE 2 GM/50ML IV SOLN
2.0000 g | Freq: Once | INTRAVENOUS | Status: AC
  Administered 2022-09-08: 2 g via INTRAVENOUS
  Filled 2022-09-08: qty 50

## 2022-09-08 MED ORDER — ALBUTEROL SULFATE (2.5 MG/3ML) 0.083% IN NEBU
2.5000 mg | INHALATION_SOLUTION | Freq: Once | RESPIRATORY_TRACT | Status: AC
  Administered 2022-09-08: 2.5 mg via RESPIRATORY_TRACT
  Filled 2022-09-08: qty 3

## 2022-09-08 NOTE — ED Triage Notes (Addendum)
Patient has been short of breath and wheezing for a few days. She drank after someone with RSV on Saturday. Coughing up blood tinged along with greenish/yellow mucus.

## 2022-09-08 NOTE — ED Provider Notes (Signed)
[facility] Provider Note   CSN: [id number] Arrival date & time: 09/08/22  1053     History  Chief Complaint  Patient presents with   Shortness of Breath    Jordan George is a 26 y.o. female.   Shortness of Breath    Patient has a history of tendinitis and does have a history of smoking.  Patient presents to the ED with complaints of shortness of breath.  Patient states she was recently exposed to someone with RSV.  For the last couple days she has been coughing.  She has been bringing up some yellow to green and blood-tinged mucus.  She is feeling very short of breath.  Denies history of asthma.  No leg swelling.  Home Medications Prior to Admission medications   Medication Sig Start Date End Date Taking? Authorizing Provider  albuterol (VENTOLIN HFA) 108 (90 Base) MCG/ACT inhaler Inhale 1-2 puffs into the lungs every 6 (six) hours as needed for wheezing or shortness of breath. 09/08/22  Yes Dorie Rank, MD  benzonatate (TESSALON) 100 MG capsule Take 1 capsule (100 mg total) by mouth every 8 (eight) hours. 09/08/22  Yes Dorie Rank, MD  predniSONE (DELTASONE) 50 MG tablet Take 1 tablet (50 mg total) by mouth daily. 09/08/22  Yes Dorie Rank, MD  fluticasone [facility]) 50 MCG/ACT nasal spray Place 2 sprays into both nostrils daily for 14 days. 09/19/21 10/03/21  Tedd Sias, PA  hydrocortisone (ANUSOL-HC) 2.5 % rectal cream Place 1 application rectally 2 (two) times daily. 01/14/20   Wieters, Hallie C, PA-C  ondansetron (ZOFRAN ODT) 4 MG disintegrating tablet Take 1 tablet (4 mg total) by mouth every 8 (eight) hours as needed for nausea or vomiting. 01/14/20   Wieters, Hallie C, PA-C  promethazine-dextromethorphan (PROMETHAZINE-DM) 6.25-15 MG/5ML syrup Take 5 mLs by mouth 4 (four) times daily as needed for cough. 09/19/21   Tedd Sias, PA      Allergies    Patient has no known allergies.    Review of Systems   Review of  Systems  Respiratory:  Positive for shortness of breath.     Physical Exam Updated Vital Signs BP 111/75   Pulse (!) 116   Temp 98.6 F (37 C)   Resp 17   Ht 1.6 m ('5\' 3"'$ )   Wt 58 kg   LMP 08/24/2022   SpO2 95%   BMI 22.65 kg/m  Physical Exam Vitals and nursing note reviewed.  Constitutional:      General: She is not in acute distress.    Appearance: She is well-developed.  HENT:     Head: Normocephalic and atraumatic.     Right Ear: External ear normal.     Left Ear: External ear normal.  Eyes:     General: No scleral icterus.       Right eye: No discharge.        Left eye: No discharge.     Conjunctiva/sclera: Conjunctivae normal.  Neck:     Trachea: No tracheal deviation.  Cardiovascular:     Rate and Rhythm: Regular rhythm. Tachycardia present.  Pulmonary:     Effort: Tachypnea and accessory muscle usage present. No respiratory distress.     Breath sounds: No stridor. Wheezing present. No rales.  Abdominal:     General: Bowel sounds are normal. There is no distension.     Palpations: Abdomen is soft.     Tenderness: There is no abdominal tenderness. There  is no guarding or rebound.  Musculoskeletal:        General: No tenderness or deformity.     Cervical back: Neck supple.  Skin:    General: Skin is warm and dry.     Findings: No rash.  Neurological:     General: No focal deficit present.     Mental Status: She is alert.     Cranial Nerves: No cranial nerve deficit, dysarthria or facial asymmetry.     Sensory: No sensory deficit.     Motor: No abnormal muscle tone or seizure activity.     Coordination: Coordination normal.  Psychiatric:        Mood and Affect: Mood normal.     ED Results / Procedures / Treatments   Labs (all labs ordered are listed, but only abnormal results are displayed) Labs Reviewed  CBC - Abnormal; Notable for the following components:      Result Value   WBC 12.5 (*)    All other components within normal limits  BASIC  METABOLIC PANEL - Abnormal; Notable for the following components:   Potassium 3.4 (*)    CO2 20 (*)    All other components within normal limits  I-STAT BETA HCG BLOOD, ED ([facility], [facility], [facility] ONLY)    EKG EKG Interpretation  Date/Time:  Thursday September 08 2022 11:38:58 EST Ventricular Rate:  103 PR Interval:  120 QRS Duration: 89 QT Interval:  341 QTC Calculation: 447 R Axis:   71 Text Interpretation: Sinus tachycardia Artifact in lead(s) II aVL aVF No old tracing to compare Confirmed by Dorie Rank [phone]) on 09/08/2022 12:16:29 PM  Radiology DG Chest 2 View  Result Date: 09/08/2022 CLINICAL DATA:  Cough. Shortness of breath and chest tightness. Wheezing. EXAM: CHEST - 2 VIEW COMPARISON:  09/19/2021 FINDINGS: The heart size and mediastinal contours are within normal limits. Both lungs are clear. The visualized skeletal structures are unremarkable. IMPRESSION: No active cardiopulmonary disease. Electronically Signed   By: Kerby Moors M.D.   On: 09/08/2022 11:38    Procedures Procedures    Medications Ordered in ED Medications  sodium chloride 0.9 % bolus 1,000 mL (0 mLs Intravenous Stopped 09/08/22 1319)    Followed by  0.9 %  sodium chloride infusion (1,000 mLs Intravenous New Bag/Given 09/08/22 1138)  methylPREDNISolone sodium succinate (SOLU-MEDROL) 125 mg/2 mL injection 125 mg (125 mg Intravenous Given 09/08/22 1138)  ipratropium-albuterol (DUONEB) 0.5-2.5 (3) MG/3ML nebulizer solution 3 mL (3 mLs Nebulization Given 09/08/22 1138)  magnesium sulfate IVPB 2 g 50 mL (0 g Intravenous Stopped 09/08/22 1236)  ondansetron (ZOFRAN) injection 4 mg (4 mg Intravenous Given 09/08/22 1239)  albuterol (PROVENTIL) (2.5 MG/3ML) 0.083% nebulizer solution 2.5 mg (2.5 mg Nebulization Given 09/08/22 1318)    ED Course/ Medical Decision Making/ A&P Clinical Course as of 09/08/22 1518  Thu Sep 08, 2022  1300 CBC(!) White blood cell count elevated [JK]  123456 Basic metabolic panel(!) Metabolic panel  normal except slightly decreased potassium and bicarb [JK]  1300 I-Stat Beta hCG blood, ED ([facility], [facility], [facility] only) Pregnancy test negative [JK]  1300 Chest x-ray negative for acute findings [JK]  1303 Patient initially noted some improvement of breathing but now feels like she is wheezing again.  Will order additional treatments [id number] Patient reexamined.  Feels significantly better.  Still has an occasional wheeze [JK]    Clinical Course User Index [JK] Dorie Rank, MD  Medical Decision Making Problems Addressed: Bronchitis with bronchospasm: acute illness or injury that poses a threat to life or bodily functions  Amount and/or Complexity of Data Reviewed Labs: ordered. Decision-making details documented in ED Course. Radiology: ordered.  Risk Prescription drug management.    patient presented to ED for evaluation of shortness of breath in the setting of recent RSV exposure.  ED workup notable for wheezing on exam.  Patient's laboratory tests are unremarkable.  Chest x-ray does not show evidence of pneumonia.  Suspect bronchospasm related to her smoking history as well as her acute viral infection.  Patient continued to have wheezing after initial treatment so she was given additional treatment.  Patient was also treated with magnesium and steroids.  Patient is feeling better at this time.  Discussed options of being admitted to the hospital versus outpatient management.  Patient states she is feeling well enough and would like to go home.  Warning signs precautions discussed.  We also discussed smoking cessation and the importance of trying to quit        Final Clinical Impression(s) / ED Diagnoses Final diagnoses:  Bronchitis with bronchospasm    Rx / DC Orders ED Discharge Orders          Ordered    predniSONE (DELTASONE) 50 MG tablet  Daily        09/08/22 1516    albuterol (VENTOLIN HFA) 108 (90 Base) MCG/ACT inhaler  Every 6 hours PRN         09/08/22 1516    benzonatate (TESSALON) 100 MG capsule  Every 8 hours        09/08/22 1516              Dorie Rank, MD 09/08/22 [id number]

## 2022-09-08 NOTE — Discharge Instructions (Signed)
try to quit smoking as we discussed. take medication the medications to help with your wheezing and coughing.  Follow-up with your primary care doctor to be rechecked next week.  Return to the ED for worsening symptoms

## 2022-09-09 ENCOUNTER — Telehealth: Payer: Self-pay

## 2022-09-09 DIAGNOSIS — Z419 Encounter for procedure for purposes other than remedying health state, unspecified: Secondary | ICD-10-CM | POA: Diagnosis not present

## 2022-09-09 NOTE — Transitions of Care (Post Inpatient/ED Visit) (Signed)
   09/09/2022  Name: Defne Cambridge MRN: [id number] DOB: 08/05/1995  Today's TOC FU Call Status: Today's TOC FU Call Status:: Unsuccessul Call (1st Attempt)  Transition Care Management Follow-up Telephone Call Date of Discharge: 09/08/22 Discharge Facility: [facility]) Type of Discharge: Emergency Department Reason for ED Visit: Other: (Bronitiics) How have you been since you were released from the hospital?: Same Any questions or concerns?: No  Items Reviewed: Did you receive and understand the discharge instructions provided?: Yes Medications obtained and verified?: Yes (Medications Reviewed) Any new allergies since your discharge?: No Dietary orders reviewed?: NA Do you have support at home?: Yes People in Home: alone  Home Care and Equipment/Supplies: [place] Ordered?: NA Any new equipment or medical supplies ordered?: NA  Functional Questionnaire: Do you need assistance with bathing/showering or dressing?: No Do you need assistance with meal preparation?: No Do you need assistance with eating?: No Do you have difficulty maintaining continence: No Do you need assistance with getting out of bed/getting out of a chair/moving?: No Do you have difficulty managing or taking your medications?: No  Folllow up appointments reviewed: PCP Follow-up appointment confirmed?: No MD Provider Line [phone] Given: No Specialist Hospital Follow-up appointment confirmed?: No Do you need transportation to your follow-up appointment?: No Do you understand care options if your condition(s) worsen?: Yes-patient verbalized understanding  MM service accepted and SDOH resources emailed.  Mickel Fuchs, BSW, [place] Managed Medicaid Team  [phone]

## 2022-09-09 NOTE — Patient Instructions (Signed)
Visit Information  Ms. Jordan George was given information about Medicaid Managed Care team care coordination services as a part of their [facility] Medicaid benefit. Jordan George verbally consented to engagement with the [facility] Managed Care team.   If you are experiencing a medical emergency, please call 911 or report to your local emergency department or urgent care.   If you have a non-emergency medical problem during routine business hours, please contact your provider's office and ask to speak with a nurse.   For questions related to your [facility] health plan, please call: [phone] or go here:[email or web address]  If you would like to schedule transportation through your [facility] plan, please call the following number at least 2 days in advance of your appointment: [phone].  You can also use the MTM portal or MTM mobile app to manage your rides. For the portal, please go to [email or web address].  Call the [place] at [phone], at any time, 24 hours a day, 7 days a week. If you are in danger or need immediate medical attention call 911.  If you would like help to quit smoking, call 1-800-QUIT-NOW 9545187049) OR Espaol: 1-855-Djelo-Ya HD:1601594) o para ms informacin haga clic aqu or Text READY to 200-400 to register via text  Jordan George - following are the goals we discussed in your visit today:   Goals Addressed   None       Social Worker will follow up in 30 days.   Jordan George, BSW, [place] Managed Medicaid Team  [phone]   Following is a copy of your plan of care:  There are no care plans that you recently modified to display for this patient.

## 2022-09-15 ENCOUNTER — Encounter (HOSPITAL_COMMUNITY): Payer: Self-pay

## 2022-09-15 ENCOUNTER — Emergency Department (HOSPITAL_COMMUNITY)
Admission: EM | Admit: 2022-09-15 | Discharge: 2022-09-15 | Discharge status: Against Medical Advice | Payer: Medicaid Other | Attending: Emergency Medicine | Admitting: Emergency Medicine

## 2022-09-15 ENCOUNTER — Emergency Department (HOSPITAL_COMMUNITY): Payer: Medicaid Other

## 2022-09-15 DIAGNOSIS — R079 Chest pain, unspecified: Secondary | ICD-10-CM | POA: Insufficient documentation

## 2022-09-15 DIAGNOSIS — R059 Cough, unspecified: Secondary | ICD-10-CM | POA: Diagnosis not present

## 2022-09-15 DIAGNOSIS — Z5321 Procedure and treatment not carried out due to patient leaving prior to being seen by health care provider: Secondary | ICD-10-CM | POA: Insufficient documentation

## 2022-09-15 DIAGNOSIS — R0789 Other chest pain: Secondary | ICD-10-CM | POA: Diagnosis not present

## 2022-09-15 DIAGNOSIS — R0602 Shortness of breath: Secondary | ICD-10-CM | POA: Diagnosis not present

## 2022-09-15 LAB — BASIC METABOLIC PANEL
Anion gap: 5 (ref 5–15)
BUN: 9 mg/dL (ref 6–20)
CO2: 21 mmol/L — ABNORMAL LOW (ref 22–32)
Calcium: 8.8 mg/dL — ABNORMAL LOW (ref 8.9–10.3)
Chloride: 110 mmol/L (ref 98–111)
Creatinine, Ser: 0.72 mg/dL (ref 0.44–1.00)
GFR, Estimated: >60 mL/min (ref >60)
Glucose, Bld: 91 mg/dL (ref 70–99)
Potassium: 4 mmol/L (ref 3.5–5.1)
Sodium: 136 mmol/L (ref 135–145)

## 2022-09-15 LAB — CBC
HCT: 40.9 % (ref 36.0–46.0)
Hemoglobin: 13.2 g/dL (ref 12.0–15.0)
MCH: 28.7 pg (ref 26.0–34.0)
MCHC: 32.3 g/dL (ref 30.0–36.0)
MCV: 88.9 fL (ref 80.0–100.0)
Platelets: 310 10*3/uL (ref 150–400)
RBC: 4.6 MIL/uL (ref 3.87–5.11)
RDW: 12.9 % (ref 11.5–15.5)
WBC: 8.5 10*3/uL (ref 4.0–10.5)
nRBC: 0 % (ref 0.0–0.2)

## 2022-09-15 LAB — I-STAT BETA HCG BLOOD, ED (MC, WL, AP ONLY): I-stat hCG, quantitative: <5 m[IU]/mL (ref <5)

## 2022-09-15 LAB — TROPONIN I (HIGH SENSITIVITY): Troponin I (High Sensitivity): <2 ng/L (ref <18)

## 2022-09-15 NOTE — ED Triage Notes (Signed)
Pt presents with c/o chest pain and shortness of breath for approx 36 hours. Pt reports she was seen last week and diagnosed with bronchitis and an upper respiratory infection after her panel for flu, covid, and RSV came back negative. Pt is able to talk in complete sentences but is short of breath.

## 2022-09-15 NOTE — ED Notes (Signed)
Pt not in waiting room, pt called and she states she left and went to another hospital.

## 2022-09-15 NOTE — ED Provider Triage Note (Signed)
Emergency Medicine Provider Triage Evaluation Note  Jordan George , a 27 y.o. female  was evaluated in triage.  Pt complains of right-sided chest pain.  Was seen here 1 week ago and diagnosed with bronchitis.  Similar symptoms.  It is worse with movements and coughing.  No recent travel, surgeries, cancer treatments, unilateral leg swelling DVT or PE, contraception use.  Review of Systems  Positive: As above Negative: As above  Physical Exam  BP 112/86 (BP Location: Left Arm)   Pulse 94   Temp 98 F (36.7 C) (Oral)   Resp 18   LMP 08/24/2022   SpO2 100%  Gen:   Awake, no distress  Resp:  Normal effort  MSK:   Moves extremities without difficulty  Other:    Medical Decision Making  Medically screening exam initiated at 12:46 PM.  Appropriate orders placed.  Susanne Borders was informed that the remainder of the evaluation will be completed by another provider, this initial triage assessment does not replace that evaluation, and the importance of remaining in the ED until their evaluation is complete.     Roylene Reason, [place] 09/15/22 1247

## 2022-09-20 ENCOUNTER — Other Ambulatory Visit: Payer: Medicaid Other | Admitting: *Deleted

## 2022-09-20 NOTE — Patient Instructions (Signed)
Visit Information  Jordan George  - as a part of your Medicaid benefit, you are eligible for care management and care coordination services at no cost or copay. I was unable to reach you by phone today but would be happy to help you with your health related needs. Please feel free to call me @ [phone].   A member of the Managed Medicaid care management team will reach out to you again over the next 7 days.   Lurena Joiner RN, BSN [place]  [employer] manager

## 2022-09-20 NOTE — Patient Outreach (Signed)
  Medicaid Managed Care   Unsuccessful Attempt Note   09/20/2022 Name: Jordan George MRN: [id number] DOB: 1996-07-11  Referred by: Patient, No Pcp Per Reason for referral : High Risk Managed Medicaid (Unsuccessful RNCM initial outreach)   An unsuccessful telephone outreach was attempted today. The patient was referred to the case management team for assistance with care management and care coordination.    Follow Up Plan: A HIPAA compliant phone message was left for the patient providing contact information and requesting a return call. and The Managed Medicaid care management team will reach out to the patient again over the next 7 days.    Lurena Joiner RN, BSN [place]  [employer] manager

## 2022-09-21 ENCOUNTER — Telehealth: Payer: Self-pay

## 2022-09-21 NOTE — Telephone Encounter (Signed)
Mychart msg sent

## 2022-10-03 ENCOUNTER — Other Ambulatory Visit: Payer: Medicaid Other | Admitting: *Deleted

## 2022-10-03 NOTE — Patient Instructions (Signed)
Visit Information  Ms. Susanne Borders  - as a part of your Medicaid benefit, you are eligible for care management and care coordination services at no cost or copay. I was unable to reach you by phone today but would be happy to help you with your health related needs. Please feel free to call me @ [phone].   A member of the Managed Medicaid care management team will reach out to you again over the next 7 days.   Lurena Joiner RN, BSN [place] [facility] RN Care Coordinator [phone]

## 2022-10-03 NOTE — Patient Outreach (Signed)
  Medicaid Managed Care   Unsuccessful Attempt Note   10/03/2022 Name: Jordan George MRN: [id number] DOB: 1996-06-10  Referred by: Patient, No Pcp Per Reason for referral : High Risk Managed Medicaid (Unsuccessful RNCM initial outreach)   An unsuccessful telephone outreach was attempted today. The patient was referred to the case management team for assistance with care management and care coordination.    Follow Up Plan: The Managed Medicaid care management team will reach out to the patient again over the next 7 days.    Lurena Joiner RN, BSN [facility] RN Care Coordinator [phone]

## 2022-10-07 ENCOUNTER — Telehealth: Payer: Self-pay

## 2022-10-07 NOTE — Telephone Encounter (Signed)
..   Medicaid Managed Care   Unsuccessful Outreach Note  10/07/2022 Name: Jordan George MRN: [id number] DOB: 12-14-1995  Referred by: Patient, No Pcp Per Reason for referral : Appointment (I called the patient on both of her numbers today to get her missed phone appointment with the MM RNCM rescheduled. Both numbers were not in working order.)   Third unsuccessful telephone outreach was attempted today. The patient was referred to the case management team for assistance with care management and care coordination. The patient's primary care provider has been notified of our unsuccessful attempts to make or maintain contact with the patient. The care management team is pleased to engage with this patient at any time in the future should he/she be interested in assistance from the care management team.   Follow Up Plan: We have been unable to make contact with the patient for follow up. The care management team is available to follow up with the patient after provider conversation with the patient regarding recommendation for care management engagement and subsequent re-referral to the care management team.   [place]  [phone]

## 2022-10-10 ENCOUNTER — Other Ambulatory Visit: Payer: Medicaid Other

## 2022-10-10 DIAGNOSIS — Z419 Encounter for procedure for purposes other than remedying health state, unspecified: Secondary | ICD-10-CM | POA: Diagnosis not present

## 2022-10-10 NOTE — Patient Outreach (Signed)
  Medicaid Managed Care   Unsuccessful Outreach Note  10/10/2022 Name: Jordan George MRN: [id number] DOB: 25-Mar-1996  Referred by: Patient, No Pcp Per Reason for referral : High Risk Managed Medicaid (MM Social work unsuccessful telephone outreach )   An unsuccessful telephone outreach was attempted today. The patient was referred to the case management team for assistance with care management and care coordination.   Follow Up Plan: A HIPAA compliant phone message was left for the patient providing contact information and requesting a return call.   Mickel Fuchs, BSW, [place] Managed Medicaid Team  [phone]

## 2022-10-10 NOTE — Patient Instructions (Signed)
  Medicaid Managed Care   Unsuccessful Outreach Note  10/10/2022 Name: Jordan George MRN: [id number] DOB: 1996-05-04  Referred by: Patient, No Pcp Per Reason for referral : High Risk Managed Medicaid (MM Social work unsuccessful telephone outreach )   An unsuccessful telephone outreach was attempted today. The patient was referred to the case management team for assistance with care management and care coordination.   Follow Up Plan: A HIPAA compliant phone message was left for the patient providing contact information and requesting a return call.    Mickel Fuchs, BSW, [place] Managed Medicaid Team  [phone]

## 2022-11-09 DIAGNOSIS — Z419 Encounter for procedure for purposes other than remedying health state, unspecified: Secondary | ICD-10-CM | POA: Diagnosis not present

## 2022-11-30 IMAGING — CR DG CHEST 2V
2 series · 2 of 2 positions shown · non-contrast
Comparison: None.

CLINICAL DATA: Cough, chills, and body aches for 2 months.

EXAM:
CHEST - 2 VIEW

[w chest pa]
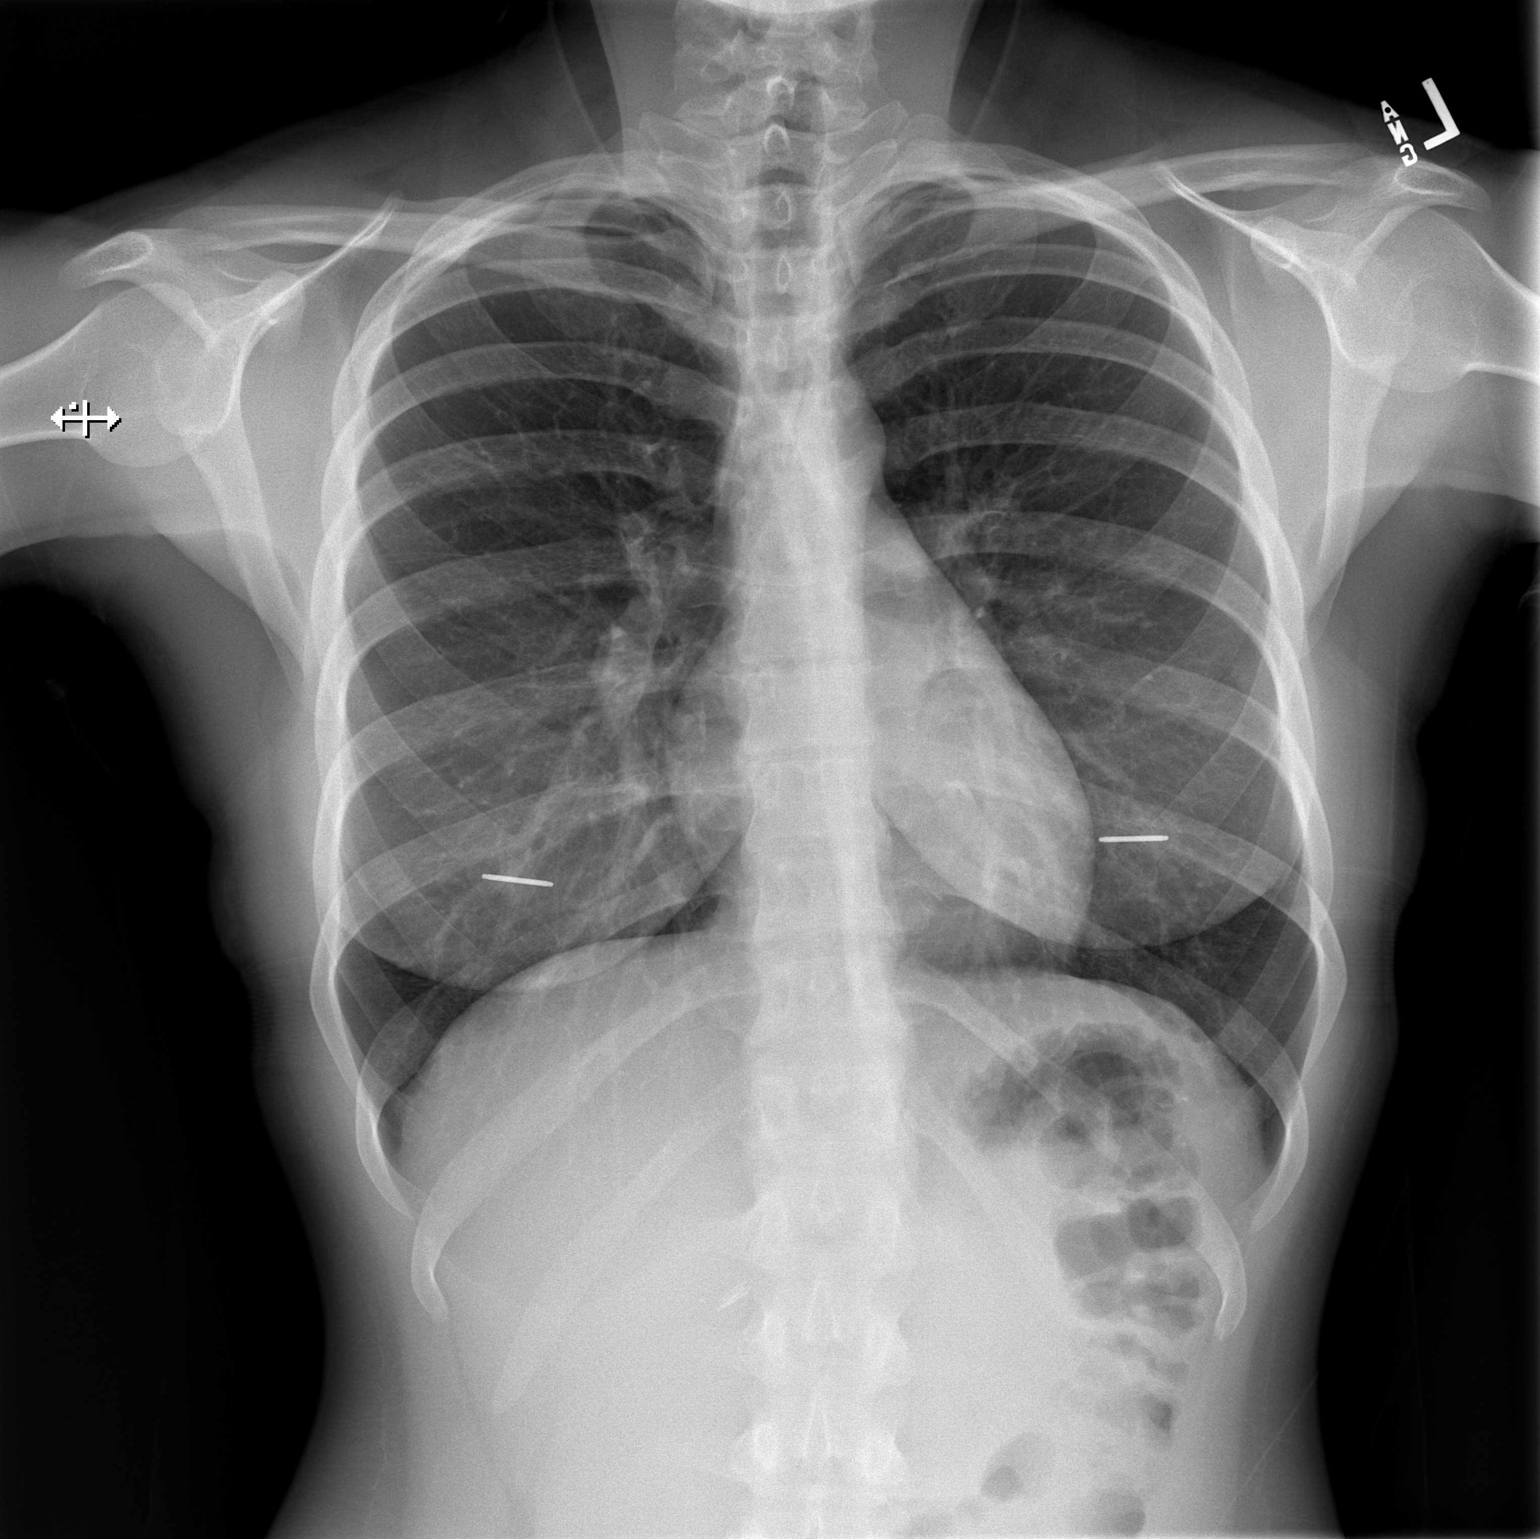

[w chest lat]
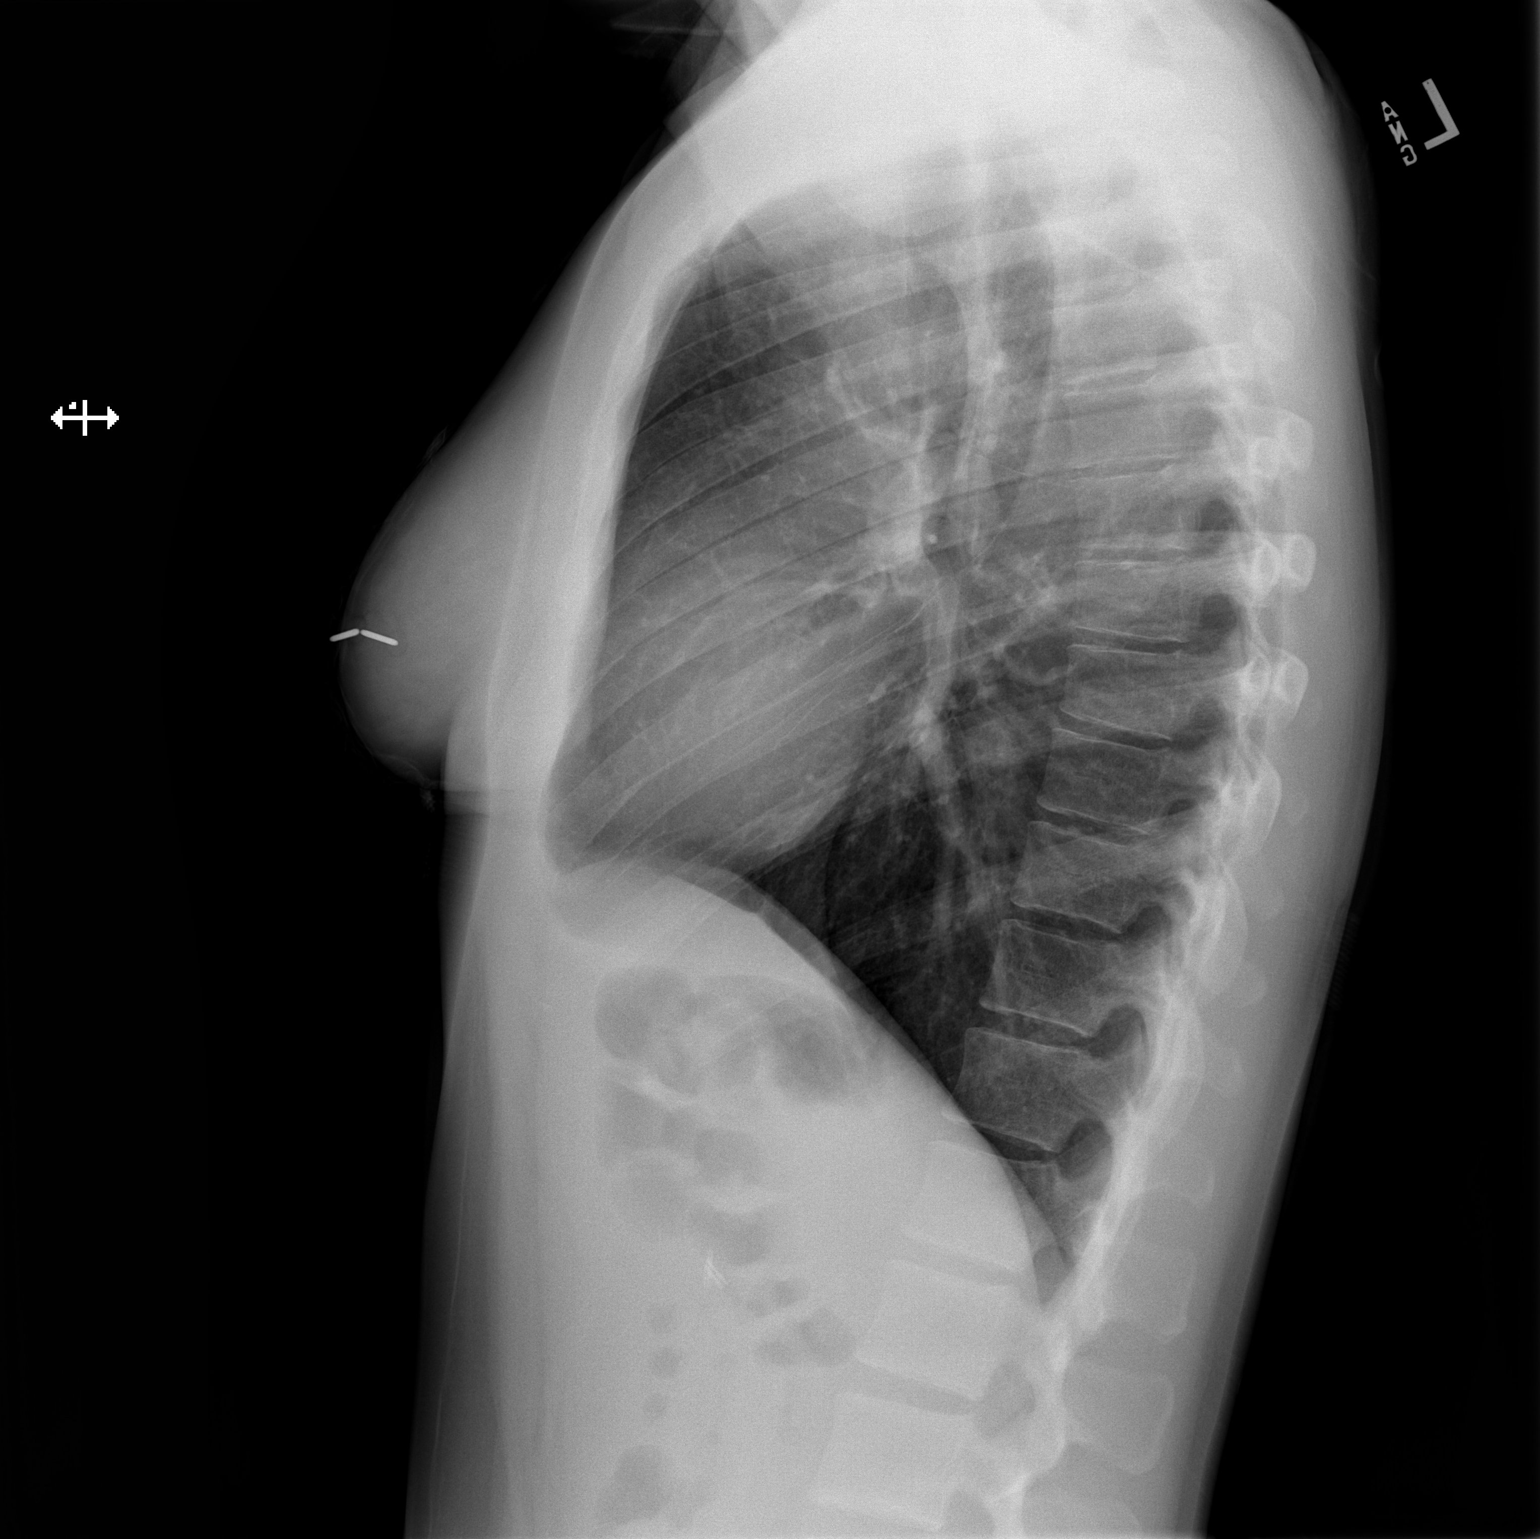

[2 of 2 positions shown; findings below may reference images not displayed]

FINDINGS: The heart size and mediastinal contours are within normal limits.
Both lungs are clear. The visualized skeletal structures are
unremarkable.
IMPRESSION: No active cardiopulmonary disease.

## 2022-12-10 DIAGNOSIS — Z419 Encounter for procedure for purposes other than remedying health state, unspecified: Secondary | ICD-10-CM | POA: Diagnosis not present

## 2023-01-09 DIAGNOSIS — Z419 Encounter for procedure for purposes other than remedying health state, unspecified: Secondary | ICD-10-CM | POA: Diagnosis not present

## 2023-02-09 DIAGNOSIS — Z419 Encounter for procedure for purposes other than remedying health state, unspecified: Secondary | ICD-10-CM | POA: Diagnosis not present

## 2023-02-24 ENCOUNTER — Emergency Department (HOSPITAL_BASED_OUTPATIENT_CLINIC_OR_DEPARTMENT_OTHER)
Admission: EM | Admit: 2023-02-24 | Discharge: 2023-02-24 | Disposition: A | Discharge status: Home / Self Care | Payer: 59 | Attending: Emergency Medicine | Admitting: Emergency Medicine

## 2023-02-24 ENCOUNTER — Encounter (HOSPITAL_BASED_OUTPATIENT_CLINIC_OR_DEPARTMENT_OTHER): Payer: Self-pay | Admitting: Emergency Medicine

## 2023-02-24 ENCOUNTER — Other Ambulatory Visit: Payer: Self-pay

## 2023-02-24 DIAGNOSIS — Z20822 Contact with and (suspected) exposure to covid-19: Secondary | ICD-10-CM | POA: Insufficient documentation

## 2023-02-24 DIAGNOSIS — R059 Cough, unspecified: Secondary | ICD-10-CM | POA: Diagnosis not present

## 2023-02-24 DIAGNOSIS — B9789 Other viral agents as the cause of diseases classified elsewhere: Secondary | ICD-10-CM | POA: Diagnosis not present

## 2023-02-24 DIAGNOSIS — R509 Fever, unspecified: Secondary | ICD-10-CM | POA: Diagnosis not present

## 2023-02-24 DIAGNOSIS — J069 Acute upper respiratory infection, unspecified: Secondary | ICD-10-CM | POA: Diagnosis not present

## 2023-02-24 LAB — RESP PANEL BY RT-PCR (RSV, FLU A&B, COVID)  RVPGX2
Influenza A by PCR: NEGATIVE
Influenza B by PCR: NEGATIVE
Resp Syncytial Virus by PCR: NEGATIVE
SARS Coronavirus 2 by RT PCR: NEGATIVE

## 2023-02-24 NOTE — ED Provider Notes (Signed)
[place] EMERGENCY DEPARTMENT AT [facility] Provider Note   CSN: [id number] Arrival date & time: 02/24/23  1333     History  Chief Complaint  Patient presents with   Fever    Jordan George is a 27 y.o. female.  Patient is a healthy 27 year old female who woke up in the middle of the night with cough which then developed into body aches, congestion, fever up to 102.  She has not had any nausea vomiting.  No shortness of breath or chest pain.  She has had multiple sick contacts recently.  The history is provided by the patient.  Fever      Home Medications Prior to Admission medications   Medication Sig Start Date End Date Taking? Authorizing Provider  albuterol (VENTOLIN HFA) 108 (90 Base) MCG/ACT inhaler Inhale 1-2 puffs into the lungs every 6 (six) hours as needed for wheezing or shortness of breath. 09/08/22   Linwood Dibbles, MD  benzonatate (TESSALON) 100 MG capsule Take 1 capsule (100 mg total) by mouth every 8 (eight) hours. 09/08/22   Linwood Dibbles, MD  fluticasone (FLONASE) 50 MCG/ACT nasal spray Place 2 sprays into both nostrils daily for 14 days. 09/19/21 10/03/21  Gailen Shelter, PA  hydrocortisone (ANUSOL-HC) 2.5 % rectal cream Place 1 application rectally 2 (two) times daily. 01/14/20   Wieters, Hallie C, PA-C  ondansetron (ZOFRAN ODT) 4 MG disintegrating tablet Take 1 tablet (4 mg total) by mouth every 8 (eight) hours as needed for nausea or vomiting. 01/14/20   Wieters, Hallie C, PA-C  predniSONE (DELTASONE) 50 MG tablet Take 1 tablet (50 mg total) by mouth daily. 09/08/22   Linwood Dibbles, MD  promethazine-dextromethorphan (PROMETHAZINE-DM) 6.25-15 MG/5ML syrup Take 5 mLs by mouth 4 (four) times daily as needed for cough. 09/19/21   Gailen Shelter, PA      Allergies    Patient has no known allergies.    Review of Systems   Review of Systems  Constitutional:  Positive for fever.    Physical Exam Updated Vital Signs BP 123/84 (BP Location: Right Arm)    Pulse (!) 110   Temp 98.6 F (37 C) (Oral)   Resp 18   SpO2 100%  Physical Exam Vitals and nursing note reviewed.  Constitutional:      General: She is not in acute distress.    Appearance: She is well-developed.  HENT:     Head: Normocephalic and atraumatic.     Right Ear: Tympanic membrane normal.     Left Ear: Tympanic membrane normal.     Nose: Congestion present.     Mouth/Throat:     Pharynx: Posterior oropharyngeal erythema present. No oropharyngeal exudate.  Eyes:     Pupils: Pupils are equal, round, and reactive to light.  Cardiovascular:     Rate and Rhythm: Regular rhythm. Tachycardia present.     Heart sounds: Normal heart sounds. No murmur heard.    No friction rub.  Pulmonary:     Effort: Pulmonary effort is normal.     Breath sounds: Normal breath sounds. No wheezing or rales.  Abdominal:     General: Bowel sounds are normal. There is no distension.     Palpations: Abdomen is soft.     Tenderness: There is no abdominal tenderness. There is no guarding or rebound.  Musculoskeletal:        General: No tenderness. Normal range of motion.     Comments: No edema  Lymphadenopathy:  Cervical: No cervical adenopathy.  Skin:    General: Skin is warm and dry.     Findings: No rash.  Neurological:     Mental Status: She is alert and oriented to person, place, and time. Mental status is at baseline.     Cranial Nerves: No cranial nerve deficit.  Psychiatric:        Behavior: Behavior normal.     ED Results / Procedures / Treatments   Labs (all labs ordered are listed, but only abnormal results are displayed) Labs Reviewed  RESP PANEL BY RT-PCR (RSV, FLU A&B, COVID)  [id number]    EKG None  Radiology No results found.  Procedures Procedures    Medications Ordered in ED Medications - No data to display  ED Course/ Medical Decision Making/ A&P                                 Medical Decision Making  Pt with symptoms consistent with viral URI.   Well appearing here.  No signs of breathing difficulty  No signs of pharyngitis, otitis or abnormal abdominal findings.   Covid and flu are neg and pt to return with any further problems.         Final Clinical Impression(s) / ED Diagnoses Final diagnoses:  Viral URI with cough    Rx / DC Orders ED Discharge Orders     None         Gwyneth Sprout, MD 02/24/23 1536

## 2023-02-24 NOTE — Discharge Instructions (Signed)
Try honey for the cough, over-the-counter medications just for symptom control.  Tylenol or ibuprofen as needed for fever.  Drink plenty of fluids and rest

## 2023-02-24 NOTE — ED Triage Notes (Addendum)
Pt presents to ED POV. Pt c/o fever, cough, rhinorrhea since this morning. Pt reports that everyone at her job has been sick w/ covid and flu. Temp of 102 at home. Pt reports tylenol ~0900 this morning.

## 2023-03-12 DIAGNOSIS — Z419 Encounter for procedure for purposes other than remedying health state, unspecified: Secondary | ICD-10-CM | POA: Diagnosis not present

## 2023-04-11 DIAGNOSIS — Z419 Encounter for procedure for purposes other than remedying health state, unspecified: Secondary | ICD-10-CM | POA: Diagnosis not present

## 2023-05-12 DIAGNOSIS — Z419 Encounter for procedure for purposes other than remedying health state, unspecified: Secondary | ICD-10-CM | POA: Diagnosis not present

## 2023-06-11 DIAGNOSIS — Z419 Encounter for procedure for purposes other than remedying health state, unspecified: Secondary | ICD-10-CM | POA: Diagnosis not present

## 2023-07-12 DIAGNOSIS — Z419 Encounter for procedure for purposes other than remedying health state, unspecified: Secondary | ICD-10-CM | POA: Diagnosis not present

## 2023-08-12 DIAGNOSIS — Z419 Encounter for procedure for purposes other than remedying health state, unspecified: Secondary | ICD-10-CM | POA: Diagnosis not present

## 2023-08-21 DIAGNOSIS — R0602 Shortness of breath: Secondary | ICD-10-CM | POA: Diagnosis not present

## 2023-08-21 DIAGNOSIS — H6993 Unspecified Eustachian tube disorder, bilateral: Secondary | ICD-10-CM | POA: Diagnosis not present

## 2023-08-21 DIAGNOSIS — J0100 Acute maxillary sinusitis, unspecified: Secondary | ICD-10-CM | POA: Diagnosis not present

## 2023-08-21 DIAGNOSIS — R110 Nausea: Secondary | ICD-10-CM | POA: Diagnosis not present

## 2023-08-22 ENCOUNTER — Telehealth: Payer: 59 | Admitting: Nurse Practitioner

## 2023-08-22 DIAGNOSIS — R6889 Other general symptoms and signs: Secondary | ICD-10-CM | POA: Diagnosis not present

## 2023-08-22 MED ORDER — FLUTICASONE PROPIONATE 50 MCG/ACT NA SUSP: 2.0000 | Freq: Every day | NASAL | 6 refills | Status: AC

## 2023-08-22 MED ORDER — PROMETHAZINE-DM 6.25-15 MG/5ML PO SYRP: 5.0000 mL | ORAL_SOLUTION | Freq: Four times a day (QID) | ORAL | 0 refills | Status: AC | PRN

## 2023-08-22 MED ORDER — OSELTAMIVIR PHOSPHATE 75 MG PO CAPS: 75.0000 mg | ORAL_CAPSULE | Freq: Two times a day (BID) | ORAL | 0 refills | Status: AC

## 2023-08-22 NOTE — Progress Notes (Signed)
Virtual Visit Consent   Jordan George, you are scheduled for a virtual visit with a [facility] provider today. Just as with appointments in the office, your consent must be obtained to participate. Your consent will be active for this visit and any virtual visit you may have with one of our providers in the next 365 days. If you have a MyChart account, a copy of this consent can be sent to you electronically.  As this is a virtual visit, video technology does not allow for your provider to perform a traditional examination. This may limit your provider's ability to fully assess your condition. If your provider identifies any concerns that need to be evaluated in person or the need to arrange testing (such as labs, EKG, etc.), we will make arrangements to do so. Although advances in technology are sophisticated, we cannot ensure that it will always work on either your end or our end. If the connection with a video visit is poor, the visit may have to be switched to a telephone visit. With either a video or telephone visit, we are not always able to ensure that we have a secure connection.  By engaging in this virtual visit, you consent to the provision of healthcare and authorize for your insurance to be billed (if applicable) for the services provided during this visit. Depending on your insurance coverage, you may receive a charge related to this service.  I need to obtain your verbal consent now. Are you willing to proceed with your visit today? Zachary Lachell Rochette has provided verbal consent on 08/22/2023 for a virtual visit (video or telephone). Viviano Simas, FNP  Date: 08/22/2023 1:34 PM   Virtual Visit via Video Note   I, Viviano Simas, connected with  Alveria Mcglaughlin  ([id number], [birth year]) on 08/22/23 at  1:45 PM EST by a video-enabled telemedicine application and verified that I am speaking with the correct person using two identifiers.  Location: Patient: Virtual  Visit Location Patient: Home Provider: Virtual Visit Location Provider: Home Office   I discussed the limitations of evaluation and management by telemedicine and the availability of in person appointments. The patient expressed understanding and agreed to proceed.    History of Present Illness: Jordan George is a 28 y.o. who identifies as a female who was assigned female at birth, and is being seen today for flu-like symptoms.   Symptom onset was yesterday   102 fever with body aches onset today    Went to UC yesterday at onset of symptoms (early) flu was negative at that time   Exposed to several people with the flu at [place]   Has more flu like symptoms today, cough, nasal congestion, body aches, fever, chills    Problems: There are no active problems to display for this patient.   Allergies: No Known Allergies Medications:  Current Outpatient Medications:    albuterol (VENTOLIN HFA) 108 (90 Base) MCG/ACT inhaler, Inhale 1-2 puffs into the lungs every 6 (six) hours as needed for wheezing or shortness of breath., Disp: 8 g, Rfl: 0   benzonatate (TESSALON) 100 MG capsule, Take 1 capsule (100 mg total) by mouth every 8 (eight) hours., Disp: 21 capsule, Rfl: 0   fluticasone (FLONASE) 50 MCG/ACT nasal spray, Place 2 sprays into both nostrils daily for 14 days., Disp: 11.1 mL, Rfl: 0   hydrocortisone (ANUSOL-HC) 2.5 % rectal cream, Place 1 application rectally 2 (two) times daily., Disp: 30 g, Rfl:  0   ondansetron (ZOFRAN ODT) 4 MG disintegrating tablet, Take 1 tablet (4 mg total) by mouth every 8 (eight) hours as needed for nausea or vomiting., Disp: 20 tablet, Rfl: 0   predniSONE (DELTASONE) 50 MG tablet, Take 1 tablet (50 mg total) by mouth daily., Disp: 5 tablet, Rfl: 0   promethazine-dextromethorphan (PROMETHAZINE-DM) 6.25-15 MG/5ML syrup, Take 5 mLs by mouth 4 (four) times daily as needed for cough., Disp: 118 mL, Rfl:  0  Observations/Objective: Patient is well-developed, well-nourished in no acute distress.  Resting comfortably  at home.  Head is normocephalic, atraumatic.  No labored breathing.  Speech is clear and coherent with logical content.  Patient is alert and oriented at baseline.    Assessment and Plan:  1. Flu-like symptoms (Primary) Alternate ibuprofen and tylenol for body aches and fever    - oseltamivir (TAMIFLU) 75 MG capsule; Take 1 capsule (75 mg total) by mouth 2 (two) times daily for 5 days.  Dispense: 10 capsule; Refill: 0 - promethazine-dextromethorphan (PROMETHAZINE-DM) 6.25-15 MG/5ML syrup; Take 5 mLs by mouth 4 (four) times daily as needed for cough.  Dispense: 118 mL; Refill: 0 - fluticasone (FLONASE) 50 MCG/ACT nasal spray; Place 2 sprays into both nostrils daily.  Dispense: 16 g; Refill: 6     Follow Up Instructions: I discussed the assessment and treatment plan with the patient. The patient was provided an opportunity to ask questions and all were answered. The patient agreed with the plan and demonstrated an understanding of the instructions.  A copy of instructions were sent to the patient via MyChart unless otherwise noted below.    The patient was advised to call back or seek an in-person evaluation if the symptoms worsen or if the condition fails to improve as anticipated.    Viviano Simas, FNP

## 2023-09-09 DIAGNOSIS — Z419 Encounter for procedure for purposes other than remedying health state, unspecified: Secondary | ICD-10-CM | POA: Diagnosis not present

## 2023-10-21 DIAGNOSIS — Z419 Encounter for procedure for purposes other than remedying health state, unspecified: Secondary | ICD-10-CM | POA: Diagnosis not present

## 2023-11-20 DIAGNOSIS — Z419 Encounter for procedure for purposes other than remedying health state, unspecified: Secondary | ICD-10-CM | POA: Diagnosis not present

## 2023-12-21 DIAGNOSIS — Z419 Encounter for procedure for purposes other than remedying health state, unspecified: Secondary | ICD-10-CM | POA: Diagnosis not present

## 2023-12-26 ENCOUNTER — Telehealth: Admitting: Physician Assistant

## 2023-12-26 DIAGNOSIS — J069 Acute upper respiratory infection, unspecified: Secondary | ICD-10-CM | POA: Diagnosis not present

## 2023-12-26 DIAGNOSIS — R21 Rash and other nonspecific skin eruption: Secondary | ICD-10-CM

## 2023-12-26 DIAGNOSIS — J029 Acute pharyngitis, unspecified: Secondary | ICD-10-CM

## 2023-12-26 NOTE — Progress Notes (Signed)
 Virtual Visit Consent   Jordan George, you are scheduled for a virtual visit with a [facility] provider today. Just as with appointments in the office, your consent must be obtained to participate. Your consent will be active for this visit and any virtual visit you may have with one of our providers in the next 365 days. If you have a MyChart account, a copy of this consent can be sent to you electronically.  As this is a virtual visit, video technology does not allow for your provider to perform a traditional examination. This may limit your provider's ability to fully assess your condition. If your provider identifies any concerns that need to be evaluated in person or the need to arrange testing (such as labs, EKG, etc.), we will make arrangements to do so. Although advances in technology are sophisticated, we cannot ensure that it will always work on either your end or our end. If the connection with a video visit is poor, the visit may have to be switched to a telephone visit. With either a video or telephone visit, we are not always able to ensure that we have a secure connection.  By engaging in this virtual visit, you consent to the provision of healthcare and authorize for your insurance to be billed (if applicable) for the services provided during this visit. Depending on your insurance coverage, you may receive a charge related to this service.  I need to obtain your verbal consent now. Are you willing to proceed with your visit today? Jordan George has provided verbal consent on 12/26/2023 for a virtual visit (video or telephone). Char Common Ward, PA-C  Date: 12/26/2023 4:24 PM   Virtual Visit via Video Note   I, Char Common Ward, connected with  Jordan George  ([id number], 08-Jan-1996) on 12/26/23 at  4:15 PM EDT by a video-enabled telemedicine application and verified that I am speaking with the correct person using two identifiers.  Location: Patient:  Virtual Visit Location Patient: Home Provider: Virtual Visit Location Provider: Home Office   I discussed the limitations of evaluation and management by telemedicine and the availability of in person appointments. The patient expressed understanding and agreed to proceed.    History of Present Illness: Jordan George is a 28 y.o. who identifies as a female who was assigned female at birth, and is being seen today for congestion, rhinorrhea, sore throat, and rash.  Reports two days ago she started to have throat itchiness and a rash on her arms and back.  She reports taking Benadryl with improvement .  The next day with sore throat, pain with swallowing.  Rash persisted.  She continued with Benadryl with some improvement.  She reports today experiencing cold chills.  She went to work, but was sent home.  She also began taking Mucinex  today with some relief.  Reports persistent rash with itching.  She has been applying calamine lotion.  She denies shortness of breath or wheezing.    HPI: HPI  Problems: There are no active problems to display for this patient.   Allergies: No Known Allergies Medications:  Current Outpatient Medications:    albuterol  (VENTOLIN  HFA) 108 (90 Base) MCG/ACT inhaler, Inhale 1-2 puffs into the lungs every 6 (six) hours as needed for wheezing or shortness of breath., Disp: 8 g, Rfl: 0   benzonatate  (TESSALON ) 100 MG capsule, Take 1 capsule (100 mg total) by mouth every 8 (eight) hours., Disp: 21 capsule, Rfl: 0   fluticasone  (FLONASE )  50 MCG/ACT nasal spray, Place 2 sprays into both nostrils daily for 14 days., Disp: 11.1 mL, Rfl: 0   fluticasone  (FLONASE ) 50 MCG/ACT nasal spray, Place 2 sprays into both nostrils daily., Disp: 16 g, Rfl: 6   hydrocortisone  (ANUSOL -HC) 2.5 % rectal cream, Place 1 application rectally 2 (two) times daily., Disp: 30 g, Rfl: 0   ondansetron  (ZOFRAN  ODT) 4 MG disintegrating tablet, Take 1 tablet (4 mg total) by mouth every 8 (eight)  hours as needed for nausea or vomiting., Disp: 20 tablet, Rfl: 0   predniSONE  (DELTASONE ) 50 MG tablet, Take 1 tablet (50 mg total) by mouth daily., Disp: 5 tablet, Rfl: 0   promethazine -dextromethorphan (PROMETHAZINE -DM) 6.25-15 MG/5ML syrup, Take 5 mLs by mouth 4 (four) times daily as needed for cough., Disp: 118 mL, Rfl: 0   promethazine -dextromethorphan (PROMETHAZINE -DM) 6.25-15 MG/5ML syrup, Take 5 mLs by mouth 4 (four) times daily as needed for cough., Disp: 118 mL, Rfl: 0  Observations/Objective: Patient is well-developed, well-nourished in no acute distress.  Resting comfortably  at home.  Head is normocephalic, atraumatic.  No labored breathing.  Speech is clear and coherent with logical content.  Patient is alert and oriented at baseline.    Assessment and Plan: 1. Viral upper respiratory tract infection (Primary)  2. Rash  Supportive care discussed.  No trouble breathing, sore throat, but swallowing liquids and food ok.  Pt overall well appearing in no acute distress.  In person precautions given.   Follow Up Instructions: I discussed the assessment and treatment plan with the patient. The patient was provided an opportunity to ask questions and all were answered. The patient agreed with the plan and demonstrated an understanding of the instructions.  A copy of instructions were sent to the patient via MyChart unless otherwise noted below.     The patient was advised to call back or seek an in-person evaluation if the symptoms worsen or if the condition fails to improve as anticipated.    Char Common Ward, PA-C

## 2023-12-26 NOTE — Progress Notes (Signed)
   Thank you for the details you included in the comment boxes. Those details are very helpful in determining the best course of treatment for you and help [place] to provide the best care. We recommend that you schedule a Virtual Urgent Care video visit in order for the provider to better assess what is going on.  The provider will be able to give you a more accurate diagnosis and treatment plan if we can more freely discuss your symptoms and with the addition of a virtual examination.   If you change your visit to a video visit, we will bill your insurance (similar to an office visit) and you will not be charged for this e-Visit. You will be able to stay at home and speak with the first available [facility] advanced practice provider. The link to do a video visit is in the drop down Menu tab of your Welcome screen in MyChart.       I have spent 5 minutes in review of e-visit questionnaire, review and updating patient chart, medical decision making and response to patient.   Margaretann Loveless, PA-C

## 2023-12-26 NOTE — Patient Instructions (Signed)
  Gradie Lawless, thank you for joining Char Common Ward, PA-C for today's virtual visit.  While this provider is not your primary care provider (PCP), if your PCP is located in our provider database this encounter information will be shared with them immediately following your visit.   A [facility] MyChart account gives you access to today's visit and all your visits, tests, and labs performed at [facility]  click here if you don't have a [facility] MyChart account or go to [email or web address]  Consent: (Patient) Gradie Lawless provided verbal consent for this virtual visit at the beginning of the encounter.  Current Medications:  Current Outpatient Medications:    albuterol  (VENTOLIN  HFA) 108 (90 Base) MCG/ACT inhaler, Inhale 1-2 puffs into the lungs every 6 (six) hours as needed for wheezing or shortness of breath., Disp: 8 g, Rfl: 0   benzonatate  (TESSALON ) 100 MG capsule, Take 1 capsule (100 mg total) by mouth every 8 (eight) hours., Disp: 21 capsule, Rfl: 0   fluticasone  (FLONASE ) 50 MCG/ACT nasal spray, Place 2 sprays into both nostrils daily for 14 days., Disp: 11.1 mL, Rfl: 0   fluticasone  (FLONASE ) 50 MCG/ACT nasal spray, Place 2 sprays into both nostrils daily., Disp: 16 g, Rfl: 6   hydrocortisone  (ANUSOL -HC) 2.5 % rectal cream, Place 1 application rectally 2 (two) times daily., Disp: 30 g, Rfl: 0   ondansetron  (ZOFRAN  ODT) 4 MG disintegrating tablet, Take 1 tablet (4 mg total) by mouth every 8 (eight) hours as needed for nausea or vomiting., Disp: 20 tablet, Rfl: 0   predniSONE  (DELTASONE ) 50 MG tablet, Take 1 tablet (50 mg total) by mouth daily., Disp: 5 tablet, Rfl: 0   promethazine -dextromethorphan (PROMETHAZINE -DM) 6.25-15 MG/5ML syrup, Take 5 mLs by mouth 4 (four) times daily as needed for cough., Disp: 118 mL, Rfl: 0   promethazine -dextromethorphan (PROMETHAZINE -DM) 6.25-15 MG/5ML syrup, Take 5 mLs by mouth 4 (four) times daily as needed for  cough., Disp: 118 mL, Rfl: 0   Medications ordered in this encounter:  No orders of the defined types were placed in this encounter.    *If you need refills on other medications prior to your next appointment, please contact your pharmacy*  Follow-Up: Call back or seek an in-person evaluation if the symptoms worsen or if the condition fails to improve as anticipated.  [facility] [phone]  Other Instructions Continue with Mucinex .  Can also use Flonase .  Recommend Tylenol  or Ibuprofen  as needed for fever, body aches, sore throat.  If no improvement or symptoms become worse recommend in person evaluation.    If you have been instructed to have an in-person evaluation today at a local Urgent Care facility, please use the link below. It will take you to a list of all of our available [facility], including address, phone number and hours of operation. Please do not delay care.  [facility]  If you or a family member do not have a primary care provider, use the link below to schedule a visit and establish care. When you choose a [facility] primary care physician or advanced practice provider, you gain a long-term partner in health. Find a Primary Care Provider  Learn more about [facility]'s in-office and virtual care options:  - Get Care Now

## 2024-01-20 DIAGNOSIS — Z419 Encounter for procedure for purposes other than remedying health state, unspecified: Secondary | ICD-10-CM | POA: Diagnosis not present

## 2024-02-20 DIAGNOSIS — Z419 Encounter for procedure for purposes other than remedying health state, unspecified: Secondary | ICD-10-CM | POA: Diagnosis not present

## 2024-03-22 DIAGNOSIS — Z419 Encounter for procedure for purposes other than remedying health state, unspecified: Secondary | ICD-10-CM | POA: Diagnosis not present

## 2024-03-26 ENCOUNTER — Other Ambulatory Visit: Payer: Self-pay

## 2024-03-26 ENCOUNTER — Ambulatory Visit: Payer: Self-pay | Admitting: Nurse Practitioner

## 2024-03-26 ENCOUNTER — Ambulatory Visit (INDEPENDENT_AMBULATORY_CARE_PROVIDER_SITE_OTHER)

## 2024-03-26 ENCOUNTER — Ambulatory Visit
Admission: EM | Admit: 2024-03-26 | Discharge: 2024-03-26 | Disposition: A | Discharge status: Home / Self Care | Attending: Family Medicine | Admitting: Family Medicine

## 2024-03-26 DIAGNOSIS — J029 Acute pharyngitis, unspecified: Secondary | ICD-10-CM

## 2024-03-26 DIAGNOSIS — R0602 Shortness of breath: Secondary | ICD-10-CM

## 2024-03-26 DIAGNOSIS — R059 Cough, unspecified: Secondary | ICD-10-CM | POA: Diagnosis not present

## 2024-03-26 DIAGNOSIS — R110 Nausea: Secondary | ICD-10-CM

## 2024-03-26 DIAGNOSIS — J4541 Moderate persistent asthma with (acute) exacerbation: Secondary | ICD-10-CM

## 2024-03-26 DIAGNOSIS — R051 Acute cough: Secondary | ICD-10-CM | POA: Diagnosis not present

## 2024-03-26 DIAGNOSIS — B349 Viral infection, unspecified: Secondary | ICD-10-CM | POA: Diagnosis not present

## 2024-03-26 LAB — POC COVID19/FLU A&B COMBO
Covid Antigen, POC: NEGATIVE
Influenza A Antigen, POC: NEGATIVE
Influenza B Antigen, POC: NEGATIVE

## 2024-03-26 LAB — POCT RAPID STREP A (OFFICE): Rapid Strep A Screen: NEGATIVE

## 2024-03-26 MED ORDER — DEXAMETHASONE SODIUM PHOSPHATE 10 MG/ML IJ SOLN
10.0000 mg | Freq: Once | INTRAMUSCULAR | Status: AC
  Administered 2024-03-26: 10 mg via INTRAMUSCULAR

## 2024-03-26 MED ORDER — PREDNISONE 20 MG PO TABS: 40.0000 mg | ORAL_TABLET | Freq: Every day | ORAL | 0 refills | Status: AC

## 2024-03-26 MED ORDER — IPRATROPIUM-ALBUTEROL 0.5-2.5 (3) MG/3ML IN SOLN
3.0000 mL | Freq: Once | RESPIRATORY_TRACT | Status: AC
  Administered 2024-03-26: 3 mL via RESPIRATORY_TRACT

## 2024-03-26 MED ORDER — BENZONATATE 200 MG PO CAPS: 200.0000 mg | ORAL_CAPSULE | Freq: Three times a day (TID) | ORAL | 0 refills | Status: AC | PRN

## 2024-03-26 MED ORDER — ONDANSETRON 4 MG PO TBDP
4.0000 mg | ORAL_TABLET | Freq: Once | ORAL | Status: AC
  Administered 2024-03-26: 4 mg via ORAL

## 2024-03-26 MED ORDER — ALBUTEROL SULFATE HFA 108 (90 BASE) MCG/ACT IN AERS: 1.0000 | INHALATION_SPRAY | Freq: Four times a day (QID) | RESPIRATORY_TRACT | 0 refills | Status: AC | PRN

## 2024-03-26 NOTE — ED Provider Notes (Signed)
 [facility]    CSN: [id number] Arrival date & time: 03/26/24  0820      History   Chief Complaint Chief Complaint  Patient presents with   Shortness of Breath   Cough   Generalized Body Aches    HPI Jordan George is a 28 y.o. female  presents for evaluation of URI symptoms for 2 days. Patient reports associated symptoms of cough, congestion, headache, body aches, sore throat, posttussive vomiting, shortness of breath. Denies N/V/D, ear pain. Patient does has been using her inhaler with mild improvement, last use was several hours ago. have a hx of asthma. Patient has a history of smoking.  Reports no known sick contacts.  Pt has taken theraflu OTC for symptoms. Pt has no other concerns at this time.    Shortness of Breath Associated symptoms: cough, sore throat and vomiting   Cough Associated symptoms: myalgias, shortness of breath and sore throat     Past Medical History:  Diagnosis Date   Carpal tunnel syndrome    Ganglion cyst    Tendonitis     There are no active problems to display for this patient.   Past Surgical History:  Procedure Laterality Date   CHOLECYSTECTOMY      OB History   No obstetric history on file.      Home Medications    Prior to Admission medications   Medication Sig Start Date End Date Taking? Authorizing Provider  albuterol  (VENTOLIN  HFA) 108 (90 Base) MCG/ACT inhaler Inhale 1-2 puffs into the lungs every 6 (six) hours as needed. 03/26/24  Yes Lasalle Abee, Jodi R, NP  benzonatate  (TESSALON ) 200 MG capsule Take 1 capsule (200 mg total) by mouth 3 (three) times daily as needed. 03/26/24  Yes Starkeisha Vanwinkle, Jodi R, NP  predniSONE  (DELTASONE ) 20 MG tablet Take 2 tablets (40 mg total) by mouth daily with breakfast for 5 days. 03/27/24 04/01/24 Yes Tanicka Bisaillon, Jodi R, NP  fluticasone  (FLONASE ) 50 MCG/ACT nasal spray Place 2 sprays into both nostrils daily for 14 days. 09/19/21 10/03/21  Neldon Hamp RAMAN, PA  fluticasone  (FLONASE ) 50 MCG/ACT  nasal spray Place 2 sprays into both nostrils daily. 08/22/23   Kennyth Domino, FNP  hydrocortisone  (ANUSOL -HC) 2.5 % rectal cream Place 1 application rectally 2 (two) times daily. 01/14/20   Wieters, Hallie C, PA-C  ondansetron  (ZOFRAN  ODT) 4 MG disintegrating tablet Take 1 tablet (4 mg total) by mouth every 8 (eight) hours as needed for nausea or vomiting. 01/14/20   Wieters, Hallie C, PA-C  promethazine -dextromethorphan (PROMETHAZINE -DM) 6.25-15 MG/5ML syrup Take 5 mLs by mouth 4 (four) times daily as needed for cough. 09/19/21   Neldon Hamp RAMAN, PA  promethazine -dextromethorphan (PROMETHAZINE -DM) 6.25-15 MG/5ML syrup Take 5 mLs by mouth 4 (four) times daily as needed for cough. 08/22/23   Kennyth Domino, FNP    Family History Family History  Problem Relation Age of Onset   Healthy Mother    Healthy Father     Social History Social History   Tobacco Use   Smoking status: Never   Smokeless tobacco: Never  Vaping Use   Vaping status: Never Used  Substance Use Topics   Alcohol use: Yes    Comment: occ   Drug use: No     Allergies   Patient has no known allergies.   Review of Systems Review of Systems  HENT:  Positive for congestion and sore throat.   Respiratory:  Positive for cough and shortness of breath.   Gastrointestinal:  Positive  for vomiting.  Musculoskeletal:  Positive for myalgias.     Physical Exam Triage Vital Signs ED Triage Vitals  Encounter Vitals Group     BP 03/26/24 0832 106/63     Girls Systolic BP Percentile --      Girls Diastolic BP Percentile --      Boys Systolic BP Percentile --      Boys Diastolic BP Percentile --      Pulse Rate 03/26/24 0832 (!) 113     Resp 03/26/24 0832 (!) 24     Temp 03/26/24 0832 99.6 F (37.6 C)     Temp Source 03/26/24 0832 Oral     SpO2 03/26/24 0832 96 %     Weight --      Height --      Head Circumference --      Peak Flow --      Pain Score 03/26/24 0830 10     Pain Loc --      Pain Education --       Exclude from Growth Chart --    No data found.  Updated Vital Signs BP 106/63   Pulse (!) 113   Temp 99.6 F (37.6 C) (Oral)   Resp (!) 24   LMP 03/12/2024   SpO2 96%   Visual Acuity Right Eye Distance:   Left Eye Distance:   Bilateral Distance:    Right Eye Near:   Left Eye Near:    Bilateral Near:     Physical Exam Vitals and nursing note reviewed.  Constitutional:      General: She is not in acute distress.    Appearance: She is well-developed. She is not ill-appearing.  HENT:     Head: Normocephalic and atraumatic.     Right Ear: Tympanic membrane and ear canal normal.     Left Ear: Tympanic membrane and ear canal normal.     Nose: Congestion present.     Mouth/Throat:     Mouth: Mucous membranes are moist.     Pharynx: Oropharynx is clear. Uvula midline. Posterior oropharyngeal erythema present.     Tonsils: No tonsillar exudate or tonsillar abscesses.  Eyes:     Conjunctiva/sclera: Conjunctivae normal.     Pupils: Pupils are equal, round, and reactive to light.  Cardiovascular:     Rate and Rhythm: Normal rate and regular rhythm.     Heart sounds: Normal heart sounds.  Pulmonary:     Effort: Pulmonary effort is normal.     Breath sounds: No stridor. Wheezing present. No rhonchi or rales.  Musculoskeletal:     Cervical back: Normal range of motion and neck supple.  Lymphadenopathy:     Cervical: No cervical adenopathy.  Skin:    General: Skin is warm and dry.  Neurological:     General: No focal deficit present.     Mental Status: She is alert and oriented to person, place, and time.  Psychiatric:        Mood and Affect: Mood normal.        Behavior: Behavior normal.      UC Treatments / Results  Labs (all labs ordered are listed, but only abnormal results are displayed) Labs Reviewed  POC COVID19/FLU A&B COMBO  POCT RAPID STREP A (OFFICE)    EKG   Radiology DG Chest 2 View Result Date: 03/26/2024 CLINICAL DATA:  Cough and shortness of  breath. EXAM: CHEST - 2 VIEW COMPARISON:  September 15, 2022. FINDINGS: The heart size  and mediastinal contours are within normal limits. Both lungs are clear. The visualized skeletal structures are unremarkable. IMPRESSION: No active cardiopulmonary disease. Electronically Signed   By: Lynwood Landy Raddle M.D.   On: 03/26/2024 09:16    Procedures Procedures (including critical care time)  Medications Ordered in UC Medications  ipratropium-albuterol  (DUONEB) 0.5-2.5 (3) MG/3ML nebulizer solution 3 mL (3 mLs Nebulization Given 03/26/24 0846)  ondansetron  (ZOFRAN -ODT) disintegrating tablet 4 mg (4 mg Oral Given 03/26/24 0910)  dexamethasone  (DECADRON ) injection 10 mg (10 mg Intramuscular Given 03/26/24 0926)    Initial Impression / Assessment and Plan / UC Course  I have reviewed the triage vital signs and the nursing notes.  Pertinent labs & imaging results that were available during my care of the patient were reviewed by me and considered in my medical decision making (see chart for details).     Reviewed exam and symptoms with patient.  Chest x-ray negative.  Negative COVID flu and strep throat testing.  Discussed viral illness with asthma exacerbation.  Patient still had some chest tightness after nebulizer.  Was given IM Decadron  with improvement in the symptoms.  Vitals remained stable and lungs are CTA.  Tessalon  as needed for cough.  Start prednisone  daily tomorrow, 9/17.  Refilled albuterol  inhaler to use as needed for wheezing or shortness of breath.  Advised PCP follow-up if symptoms do not improve.  Strict ER precautions reviewed and patient verbalized understanding Final Clinical Impressions(s) / UC Diagnoses   Final diagnoses:  Acute cough  Shortness of breath  Sore throat  Nausea  Moderate persistent asthma with acute exacerbation  Viral illness     Discharge Instructions      You tested negative for COVID, flu, and strep throat.  Your chest x-ray was negative for pneumonia.  Please treat your symptoms with over the counter tylenol  or ibuprofen , humidifier, and rest.  I have refilled your albuterol  inhaler to use as needed for wheezing or shortness of breath.  You may take Tessalon  3 times a day as needed for your cough.  Start prednisone  daily tomorrow, 9/17.  Viral illnesses can last 7-14 days. Please follow up with your PCP if your symptoms are not improving. Please go to the ER for any worsening symptoms. This includes but is not limited to fever you can not control with tylenol  or ibuprofen , you are not able to stay hydrated, you have shortness of breath or chest pain.  Thank you for choosing [facility] for your healthcare needs. I hope you feel better soon!      ED Prescriptions     Medication Sig Dispense Auth. Provider   benzonatate  (TESSALON ) 200 MG capsule Take 1 capsule (200 mg total) by mouth 3 (three) times daily as needed. 20 capsule Yuette Putnam, Jodi R, NP   albuterol  (VENTOLIN  HFA) 108 (90 Base) MCG/ACT inhaler Inhale 1-2 puffs into the lungs every 6 (six) hours as needed. 1 each Coletta Lockner, Jodi R, NP   predniSONE  (DELTASONE ) 20 MG tablet Take 2 tablets (40 mg total) by mouth daily with breakfast for 5 days. 10 tablet Kabeer Hoagland, Jodi R, NP      PDMP not reviewed this encounter.   Loreda Myla SAUNDERS, NP 03/26/24 [phone]

## 2024-03-26 NOTE — ED Triage Notes (Signed)
 Pt c/o SOB, heaviness on right side of chest into right side of back, productive cough w/green/yellow mucous, nasal congestion, chest congestion, HA, bodyaches, vomiting when coughingx2d

## 2024-03-26 NOTE — Discharge Instructions (Signed)
 You tested negative for COVID, flu, and strep throat.  Your chest x-ray was negative for pneumonia. Please treat your symptoms with over the counter tylenol  or ibuprofen , humidifier, and rest.  I have refilled your albuterol  inhaler to use as needed for wheezing or shortness of breath.  You may take Tessalon  3 times a day as needed for your cough.  Start prednisone  daily tomorrow, 9/17.  Viral illnesses can last 7-14 days. Please follow up with your PCP if your symptoms are not improving. Please go to the ER for any worsening symptoms. This includes but is not limited to fever you can not control with tylenol  or ibuprofen , you are not able to stay hydrated, you have shortness of breath or chest pain.  Thank you for choosing [facility] for your healthcare needs. I hope you feel better soon!

## 2024-05-04 ENCOUNTER — Ambulatory Visit
Admission: EM | Admit: 2024-05-04 | Discharge: 2024-05-04 | Disposition: A | Discharge status: Home / Self Care | Attending: Family Medicine | Admitting: Family Medicine

## 2024-05-04 DIAGNOSIS — R197 Diarrhea, unspecified: Secondary | ICD-10-CM | POA: Diagnosis not present

## 2024-05-04 DIAGNOSIS — A084 Viral intestinal infection, unspecified: Secondary | ICD-10-CM | POA: Diagnosis not present

## 2024-05-04 DIAGNOSIS — R112 Nausea with vomiting, unspecified: Secondary | ICD-10-CM | POA: Diagnosis not present

## 2024-05-04 DIAGNOSIS — G43809 Other migraine, not intractable, without status migrainosus: Secondary | ICD-10-CM | POA: Diagnosis not present

## 2024-05-04 MED ORDER — ONDANSETRON 8 MG PO TBDP: 8.0000 mg | ORAL_TABLET | Freq: Three times a day (TID) | ORAL | 0 refills | Status: AC | PRN

## 2024-05-04 MED ORDER — LOPERAMIDE HCL 2 MG PO CAPS: 2.0000 mg | ORAL_CAPSULE | Freq: Two times a day (BID) | ORAL | 0 refills | Status: AC | PRN

## 2024-05-04 MED ORDER — NAPROXEN 500 MG PO TABS: 500.0000 mg | ORAL_TABLET | Freq: Two times a day (BID) | ORAL | 0 refills | Status: AC

## 2024-05-04 NOTE — ED Triage Notes (Signed)
 Pt present with c/o migraine, body aches, diarrhea x four days. Pt states she started vomiting earlier this morning. Pt has taken Theraflu and Ibuprofen 

## 2024-05-04 NOTE — ED Provider Notes (Signed)
 [facility]  Note:  This document was prepared using Conservation officer, historic buildings and may include unintentional dictation errors.  MRN: [id number] DOB: 10/06/1995  Subjective:   Jordan George is a 28 y.o. female presenting for 4-day history of persistent diarrhea, nausea without vomiting frontal migraines with photophobia.  She became concerned because this morning she started vomiting.  No fever, respiratory symptoms, bloody stools, recent antibiotic use, hospitalizations or long distance travel.  Has not eaten raw foods, drank unfiltered water.  No history of GI disorders including Crohn's, IBS, ulcerative colitis. Has had a cholecystectomy, done 12 years ago.  Of note, [place] and reports that she [place] a significant amount of food there.  No current facility-administered medications for this encounter.  Current Outpatient Medications:    albuterol  (VENTOLIN  HFA) 108 (90 Base) MCG/ACT inhaler, Inhale 1-2 puffs into the lungs every 6 (six) hours as needed., Disp: 1 each, Rfl: 0   benzonatate  (TESSALON ) 200 MG capsule, Take 1 capsule (200 mg total) by mouth 3 (three) times daily as needed., Disp: 20 capsule, Rfl: 0   fluticasone  (FLONASE ) 50 MCG/ACT nasal spray, Place 2 sprays into both nostrils daily for 14 days., Disp: 11.1 mL, Rfl: 0   fluticasone  (FLONASE ) 50 MCG/ACT nasal spray, Place 2 sprays into both nostrils daily., Disp: 16 g, Rfl: 6   hydrocortisone  (ANUSOL -HC) 2.5 % rectal cream, Place 1 application rectally 2 (two) times daily., Disp: 30 g, Rfl: 0   ondansetron  (ZOFRAN  ODT) 4 MG disintegrating tablet, Take 1 tablet (4 mg total) by mouth every 8 (eight) hours as needed for nausea or vomiting., Disp: 20 tablet, Rfl: 0   promethazine -dextromethorphan (PROMETHAZINE -DM) 6.25-15 MG/5ML syrup, Take 5 mLs by mouth 4 (four) times daily as needed for cough., Disp: 118 mL, Rfl: 0   promethazine -dextromethorphan (PROMETHAZINE -DM)  6.25-15 MG/5ML syrup, Take 5 mLs by mouth 4 (four) times daily as needed for cough., Disp: 118 mL, Rfl: 0   Allergies  Allergen Reactions   Blue Dyes (Parenteral) Dermatitis   Food Color Red Dermatitis   Soap Dermatitis    [place] and [place]    Past Medical History:  Diagnosis Date   Carpal tunnel syndrome    Ganglion cyst    Tendonitis      Past Surgical History:  Procedure Laterality Date   CHOLECYSTECTOMY      Family History  Problem Relation Age of Onset   Healthy Mother    Healthy Father     Social History   Tobacco Use   Smoking status: Never   Smokeless tobacco: Never  Vaping Use   Vaping status: Never Used  Substance Use Topics   Alcohol use: Yes    Comment: occ   Drug use: No    ROS   Objective:   Vitals: BP 103/64   Pulse 69   Temp 98.3 F (36.8 C)   Resp 17   LMP 04/20/2024 (Exact Date)   SpO2 98%   Physical Exam Constitutional:      General: She is not in acute distress.    Appearance: Normal appearance. She is well-developed. She is not ill-appearing, toxic-appearing or diaphoretic.  HENT:     Head: Normocephalic and atraumatic.     Right Ear: External ear normal.     Left Ear: External ear normal.     Nose: Nose normal.     Mouth/Throat:     Mouth: Mucous membranes are moist.  Eyes:  General: No scleral icterus.       Right eye: No discharge.        Left eye: No discharge.     Extraocular Movements: Extraocular movements intact.     Conjunctiva/sclera: Conjunctivae normal.  Neck:     Meningeal: Brudzinski's sign and Kernig's sign absent.  Cardiovascular:     Rate and Rhythm: Normal rate and regular rhythm.     Heart sounds: Normal heart sounds. No murmur heard.    No friction rub. No gallop.  Pulmonary:     Effort: Pulmonary effort is normal. No respiratory distress.     Breath sounds: No stridor. No wheezing, rhonchi or rales.  Chest:     Chest wall: No tenderness.  Abdominal:     General: Bowel sounds are  increased. There is no distension.     Palpations: Abdomen is soft. There is no mass.     Tenderness: There is generalized abdominal tenderness. There is no right CVA tenderness, left CVA tenderness, guarding or rebound.  Skin:    General: Skin is warm and dry.  Neurological:     General: No focal deficit present.     Mental Status: She is alert and oriented to person, place, and time.     Cranial Nerves: No cranial nerve deficit, dysarthria or facial asymmetry.     Motor: No weakness or pronator drift.     Coordination: Romberg sign negative. Coordination normal. Finger-Nose-Finger Test and Heel to [facility] Test normal. Rapid alternating movements normal.     Gait: Gait and tandem walk normal.     Deep Tendon Reflexes: Reflexes normal.  Psychiatric:        Mood and Affect: Mood normal.        Behavior: Behavior normal.        Thought Content: Thought content normal.        Judgment: Judgment normal.     Assessment and Plan :   PDMP not reviewed this encounter.  1. Viral gastroenteritis   2. Nausea vomiting and diarrhea   3. Other migraine without status migrainosus, not intractable    No signs of an acute abdomen.  Deferred imaging given clear cardiopulmonary exam, hemodynamically stable vital signs.  Will manage for viral gastroenteritis with supportive care.  Rx for Zofran  for nausea and vomiting, Imodium for diarrhea.  Patient is to push fluids and eat light meals including soups and soft foods.  I did offer the patient migraine treatment in clinic but she declined.  Counseled patient on potential for adverse effects with medications prescribed/recommended today, ER and return-to-clinic precautions discussed, patient verbalized understanding.    Christopher Savannah, [place] 05/04/24 [phone]

## 2024-05-04 NOTE — Discharge Instructions (Addendum)
 Make sure you push fluids drinking mostly water but mix it with Gatorade.  Try to eat light meals including soups, broths and soft foods, fruits.  You may use Zofran  for your nausea and vomiting once every 8 hours.  Imodium can help with diarrhea but use this carefully limiting it to 1-2 times per day only if you are having a lot of diarrhea.  You can use naproxen  for headaches, aches and pains but make sure you take it with food. Please return to the clinic if symptoms worsen or you start having severe abdominal pain not helped by taking Tylenol  or start having bloody stools or blood in the vomit.

## 2024-06-22 ENCOUNTER — Other Ambulatory Visit: Payer: Self-pay

## 2024-06-22 ENCOUNTER — Ambulatory Visit
Admission: EM | Admit: 2024-06-22 | Discharge: 2024-06-22 | Disposition: A | Discharge status: Home / Self Care | Attending: Family Medicine | Admitting: Family Medicine

## 2024-06-22 DIAGNOSIS — F430 Acute stress reaction: Secondary | ICD-10-CM

## 2024-06-22 DIAGNOSIS — K529 Noninfective gastroenteritis and colitis, unspecified: Secondary | ICD-10-CM | POA: Diagnosis not present

## 2024-06-22 NOTE — ED Provider Notes (Signed)
 [facility]    CSN: [id number] Arrival date & time: 06/22/24  9143      History   Chief Complaint No chief complaint on file.   HPI Jordan George is a 28 y.o. female presents for nausea vomiting and diarrhea.  Patient reports 1 day of nonbloody nonbilious vomiting and diarrhea.  Denies any fevers, URI symptoms, dysuria.  Does have a history of  cholecystectomy.  No recent travel.  No history of IBS, Crohn's, colitis.  Taking fluids normally.  Reports she is in a very stressful situation with her job that provides her little respite time in between shifts and feels like this is contributing to her symptoms.  States she has had more illnesses since starting this job 4 months ago.  She does have Zofran  at home from a previous prescription.  She primarily needs a work note.  HPI  Past Medical History:  Diagnosis Date   Carpal tunnel syndrome    Ganglion cyst    Tendonitis     There are no active problems to display for this patient.   Past Surgical History:  Procedure Laterality Date   CHOLECYSTECTOMY      OB History   No obstetric history on file.      Home Medications    Prior to Admission medications  Medication Sig Start Date End Date Taking? Authorizing Provider  albuterol  (VENTOLIN  HFA) 108 (90 Base) MCG/ACT inhaler Inhale 1-2 puffs into the lungs every 6 (six) hours as needed. 03/26/24   Lashaun Krapf, Jodi R, NP  benzonatate  (TESSALON ) 200 MG capsule Take 1 capsule (200 mg total) by mouth 3 (three) times daily as needed. 03/26/24   Alonzo Owczarzak, Jodi R, NP  fluticasone  (FLONASE ) 50 MCG/ACT nasal spray Place 2 sprays into both nostrils daily for 14 days. 09/19/21 10/03/21  Neldon Hamp RAMAN, PA  fluticasone  (FLONASE ) 50 MCG/ACT nasal spray Place 2 sprays into both nostrils daily. 08/22/23   Kennyth Domino, FNP  hydrocortisone  (ANUSOL -HC) 2.5 % rectal cream Place 1 application rectally 2 (two) times daily. 01/14/20   Wieters, Hallie C, PA-C  loperamide  (IMODIUM ) 2 MG  capsule Take 1 capsule (2 mg total) by mouth 2 (two) times daily as needed for diarrhea or loose stools. 05/04/24   Christopher Savannah, PA-C  naproxen  (NAPROSYN ) 500 MG tablet Take 1 tablet (500 mg total) by mouth 2 (two) times daily with a meal. 05/04/24   Christopher Savannah, PA-C  ondansetron  (ZOFRAN -ODT) 8 MG disintegrating tablet Take 1 tablet (8 mg total) by mouth every 8 (eight) hours as needed for nausea or vomiting. 05/04/24   Christopher Savannah, PA-C  promethazine -dextromethorphan (PROMETHAZINE -DM) 6.25-15 MG/5ML syrup Take 5 mLs by mouth 4 (four) times daily as needed for cough. 09/19/21   Neldon Hamp RAMAN, PA  promethazine -dextromethorphan (PROMETHAZINE -DM) 6.25-15 MG/5ML syrup Take 5 mLs by mouth 4 (four) times daily as needed for cough. 08/22/23   Kennyth Domino, FNP    Family History Family History  Problem Relation Age of Onset   Healthy Mother    Healthy Father     Social History Social History[1]   Allergies   Blue dyes (parenteral), Food color red, and Soap   Review of Systems Review of Systems  Gastrointestinal:  Positive for diarrhea, nausea and vomiting.     Physical Exam Triage Vital Signs ED Triage Vitals  Encounter Vitals Group     BP 06/22/24 0943 118/78     Girls Systolic BP Percentile --      Girls Diastolic BP  Percentile --      Boys Systolic BP Percentile --      Boys Diastolic BP Percentile --      Pulse Rate 06/22/24 0943 72     Resp 06/22/24 0943 16     Temp 06/22/24 0943 98.2 F (36.8 C)     Temp Source 06/22/24 0943 Oral     SpO2 06/22/24 0943 98 %     Weight --      Height --      Head Circumference --      Peak Flow --      Pain Score 06/22/24 0940 0     Pain Loc --      Pain Education --      Exclude from Growth Chart --    No data found.  Updated Vital Signs BP 118/78   Pulse 72   Temp 98.2 F (36.8 C) (Oral)   Resp 16   LMP 05/27/2024   SpO2 98%   Visual Acuity Right Eye Distance:   Left Eye Distance:   Bilateral Distance:    Right  Eye Near:   Left Eye Near:    Bilateral Near:     Physical Exam Vitals and nursing note reviewed.  Constitutional:      General: She is not in acute distress.    Appearance: Normal appearance. She is not ill-appearing, toxic-appearing or diaphoretic.  HENT:     Head: Normocephalic and atraumatic.  Eyes:     Pupils: Pupils are equal, round, and reactive to light.  Cardiovascular:     Rate and Rhythm: Normal rate.  Pulmonary:     Effort: Pulmonary effort is normal.  Abdominal:     General: Bowel sounds are normal. There is no distension.     Palpations: Abdomen is soft.     Tenderness: There is no abdominal tenderness. There is no guarding or rebound.  Skin:    General: Skin is warm and dry.  Neurological:     General: No focal deficit present.     Mental Status: She is alert and oriented to person, place, and time.  Psychiatric:        Mood and Affect: Mood normal.        Behavior: Behavior normal.      UC Treatments / Results  Labs (all labs ordered are listed, but only abnormal results are displayed) Labs Reviewed - No data to display  EKG   Radiology No results found.  Procedures Procedures (including critical care time)  Medications Ordered in UC Medications - No data to display  Initial Impression / Assessment and Plan / UC Course  I have reviewed the triage vital signs and the nursing notes.  Pertinent labs & imaging results that were available during my care of the patient were reviewed by me and considered in my medical decision making (see chart for details).     Reviewed exam and symptoms with patient.  Declines POC testing at this time.  Work note provided.  Patient states she already has medication at home to manage her nausea/vomiting and diarrhea.  Discussed stress reduction, rest.  She can follow-up with her PCP if her symptoms do not improve.  ER precautions reviewed. Final Clinical Impressions(s) / UC Diagnoses   Final diagnoses:   Gastroenteritis  Stress response     Discharge Instructions      Continue your already prescribed Zofran  as needed for your nausea and vomiting.  Continue doing the best that you can  to manage your stress.  Follow-up with your PCP if your symptoms are not improving.  Please go to the emergency room for any worsening symptoms.  I do hope you feel better soon!    ED Prescriptions   None    PDMP not reviewed this encounter.    [1]  Social History Tobacco Use   Smoking status: Never   Smokeless tobacco: Never  Vaping Use   Vaping status: Never Used  Substance Use Topics   Alcohol use: Yes    Comment: occ   Drug use: No     Loreda Myla SAUNDERS, NP 06/22/24 1010

## 2024-06-22 NOTE — Discharge Instructions (Signed)
 Continue your already prescribed Zofran  as needed for your nausea and vomiting.  Continue doing the best that you can to manage your stress.  Follow-up with your PCP if your symptoms are not improving.  Please go to the emergency room for any worsening symptoms.  I do hope you feel better soon!

## 2024-06-22 NOTE — ED Triage Notes (Signed)
 Pt c/o diarrhea, N/V started yesterday. Pt had 8 bouts of diarrhea in last 24 hrs. Pt vomited twice in last 24 hrs.
